# Patient Record
Sex: Female | Born: 1980 | Race: White | Hispanic: No | Marital: Single | Smoking: Current every day smoker
Health system: Southern US, Community
[De-identification: ages and names within clinical notes are randomized; demographics above are authoritative.]

## PROBLEM LIST (undated history)

## (undated) DIAGNOSIS — J40 Bronchitis, not specified as acute or chronic: Secondary | ICD-10-CM

## (undated) DIAGNOSIS — K219 Gastro-esophageal reflux disease without esophagitis: Secondary | ICD-10-CM

## (undated) HISTORY — PX: MYRINGOTOMY: SUR874

---

## 1998-02-18 ENCOUNTER — Encounter: Payer: Self-pay | Admitting: Emergency Medicine

## 1998-02-18 ENCOUNTER — Emergency Department (HOSPITAL_COMMUNITY): Admission: EM | Admit: 1998-02-18 | Discharge: 1998-02-18 | Payer: Self-pay | Admitting: Emergency Medicine

## 1998-08-15 ENCOUNTER — Emergency Department (HOSPITAL_COMMUNITY): Admission: EM | Admit: 1998-08-15 | Discharge: 1998-08-15 | Payer: Self-pay | Admitting: Internal Medicine

## 2004-06-01 ENCOUNTER — Inpatient Hospital Stay (HOSPITAL_COMMUNITY): Admission: EM | Admit: 2004-06-01 | Discharge: 2004-06-02 | Payer: Self-pay | Admitting: Emergency Medicine

## 2004-06-01 ENCOUNTER — Encounter: Payer: Self-pay | Admitting: Emergency Medicine

## 2004-11-05 ENCOUNTER — Ambulatory Visit: Payer: Self-pay | Admitting: Family Medicine

## 2004-11-06 ENCOUNTER — Ambulatory Visit: Payer: Self-pay | Admitting: Family Medicine

## 2004-12-08 ENCOUNTER — Ambulatory Visit: Payer: Self-pay | Admitting: Family Medicine

## 2005-03-14 ENCOUNTER — Emergency Department (HOSPITAL_COMMUNITY): Admission: EM | Admit: 2005-03-14 | Discharge: 2005-03-14 | Payer: Self-pay | Admitting: Emergency Medicine

## 2005-11-09 ENCOUNTER — Emergency Department (HOSPITAL_COMMUNITY): Admission: EM | Admit: 2005-11-09 | Discharge: 2005-11-09 | Payer: Self-pay | Admitting: Emergency Medicine

## 2005-11-12 ENCOUNTER — Emergency Department (HOSPITAL_COMMUNITY): Admission: EM | Admit: 2005-11-12 | Discharge: 2005-11-12 | Payer: Self-pay | Admitting: Emergency Medicine

## 2006-01-18 ENCOUNTER — Emergency Department (HOSPITAL_COMMUNITY): Admission: EM | Admit: 2006-01-18 | Discharge: 2006-01-18 | Payer: Self-pay | Admitting: Emergency Medicine

## 2007-01-20 ENCOUNTER — Encounter: Payer: Self-pay | Admitting: Family Medicine

## 2007-04-20 ENCOUNTER — Emergency Department (HOSPITAL_COMMUNITY): Admission: EM | Admit: 2007-04-20 | Discharge: 2007-04-20 | Payer: Self-pay | Admitting: Emergency Medicine

## 2008-03-12 ENCOUNTER — Emergency Department (HOSPITAL_COMMUNITY): Admission: EM | Admit: 2008-03-12 | Discharge: 2008-03-13 | Payer: Self-pay | Admitting: Emergency Medicine

## 2008-11-28 ENCOUNTER — Emergency Department (HOSPITAL_COMMUNITY): Admission: EM | Admit: 2008-11-28 | Discharge: 2008-11-28 | Payer: Self-pay | Admitting: Emergency Medicine

## 2009-08-31 ENCOUNTER — Emergency Department (HOSPITAL_COMMUNITY): Admission: EM | Admit: 2009-08-31 | Discharge: 2009-08-31 | Payer: Self-pay | Admitting: Emergency Medicine

## 2009-09-03 ENCOUNTER — Emergency Department (HOSPITAL_COMMUNITY): Admission: EM | Admit: 2009-09-03 | Discharge: 2009-09-03 | Payer: Self-pay | Admitting: Emergency Medicine

## 2009-09-23 ENCOUNTER — Emergency Department (HOSPITAL_COMMUNITY): Admission: EM | Admit: 2009-09-23 | Discharge: 2009-09-23 | Payer: Self-pay | Admitting: Emergency Medicine

## 2010-02-18 NOTE — Letter (Signed)
Summary: rpc chart  rpc chart   Imported By: Curtis Sites 10/28/2009 11:22:30  _____________________________________________________________________  External Attachment:    Type:   Image     Comment:   External Document

## 2010-04-04 LAB — CULTURE, ROUTINE-ABSCESS

## 2010-04-23 LAB — URINALYSIS, ROUTINE W REFLEX MICROSCOPIC
Glucose, UA: NEGATIVE mg/dL
Hgb urine dipstick: NEGATIVE
Ketones, ur: NEGATIVE mg/dL
Protein, ur: NEGATIVE mg/dL

## 2010-04-23 LAB — COMPREHENSIVE METABOLIC PANEL
Alkaline Phosphatase: 51 U/L (ref 39–117)
BUN: 8 mg/dL (ref 6–23)
Calcium: 9.7 mg/dL (ref 8.4–10.5)
Creatinine, Ser: 0.69 mg/dL (ref 0.4–1.2)
Glucose, Bld: 96 mg/dL (ref 70–99)
Total Protein: 7.4 g/dL (ref 6.0–8.3)

## 2010-04-23 LAB — DIFFERENTIAL
Basophils Relative: 1 % (ref 0–1)
Lymphs Abs: 3.3 10*3/uL (ref 0.7–4.0)
Monocytes Relative: 6 % (ref 3–12)
Neutro Abs: 6.4 10*3/uL (ref 1.7–7.7)
Neutrophils Relative %: 58 % (ref 43–77)

## 2010-04-23 LAB — CBC
HCT: 40.7 % (ref 36.0–46.0)
Hemoglobin: 13.9 g/dL (ref 12.0–15.0)
MCHC: 34.2 g/dL (ref 30.0–36.0)
RDW: 14 % (ref 11.5–15.5)
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
## 2011-01-02 ENCOUNTER — Emergency Department (HOSPITAL_COMMUNITY)
Admission: EM | Admit: 2011-01-02 | Discharge: 2011-01-02 | Disposition: A | Discharge status: Home / Self Care | Payer: Self-pay | Attending: Emergency Medicine | Admitting: Emergency Medicine

## 2011-01-02 DIAGNOSIS — F17200 Nicotine dependence, unspecified, uncomplicated: Secondary | ICD-10-CM | POA: Insufficient documentation

## 2011-01-02 DIAGNOSIS — K029 Dental caries, unspecified: Secondary | ICD-10-CM | POA: Insufficient documentation

## 2011-01-02 DIAGNOSIS — H9209 Otalgia, unspecified ear: Secondary | ICD-10-CM | POA: Insufficient documentation

## 2011-01-02 DIAGNOSIS — K047 Periapical abscess without sinus: Secondary | ICD-10-CM | POA: Insufficient documentation

## 2011-01-02 DIAGNOSIS — K0381 Cracked tooth: Secondary | ICD-10-CM | POA: Insufficient documentation

## 2011-01-02 MED ORDER — OXYCODONE-ACETAMINOPHEN 5-325 MG PO TABS
1.0000 | ORAL_TABLET | Freq: Once | ORAL | Status: AC
  Administered 2011-01-02: 1 via ORAL
  Filled 2011-01-02: qty 1

## 2011-01-02 MED ORDER — PENICILLIN V POTASSIUM 250 MG PO TABS
500.0000 mg | ORAL_TABLET | Freq: Once | ORAL | Status: AC
  Administered 2011-01-02: 500 mg via ORAL
  Filled 2011-01-02: qty 2

## 2011-01-02 MED ORDER — OXYCODONE-ACETAMINOPHEN 5-325 MG PO TABS: 1.0000 | ORAL_TABLET | ORAL | Status: AC | PRN

## 2011-01-02 MED ORDER — PENICILLIN V POTASSIUM 500 MG PO TABS: 500.0000 mg | ORAL_TABLET | Freq: Three times a day (TID) | ORAL | Status: AC

## 2011-01-02 NOTE — ED Provider Notes (Signed)
Medical screening examination/treatment/procedure(s) were performed by non-physician practitioner and as supervising physician I was immediately available for consultation/collaboration.  Donnetta Hutching, MD 01/02/11 2046

## 2011-01-02 NOTE — ED Provider Notes (Signed)
History     CSN: [id number] Arrival date & time: 01/02/2011  4:03 PM   First MD Initiated Contact with Patient 01/02/11 1641      Chief Complaint  Patient presents with  . Dental Pain    (Consider location/radiation/quality/duration/timing/severity/associated sxs/prior treatment) Patient is a 30 y.o. female presenting with tooth pain. The history is provided by the patient.  Dental PainThe primary symptoms include mouth pain. Primary symptoms do not include headaches, fever, shortness of breath or sore throat. Primary symptoms comment: she has had a fractured tooth for the past 2 years, but it just started causing pain and swelling 2 days ago. The symptoms began 2 days ago. The symptoms are worsening. The symptoms occur constantly.  Additional symptoms include: gum swelling, gum tenderness, jaw pain and ear pain. Additional symptoms do not include: facial swelling, pain with swallowing, excessive salivation and swollen glands. Associated symptoms comments: Patient denies fever . Medical issues include: smoking.    History reviewed. No pertinent past medical history.  History reviewed. No pertinent past surgical history.  No family history on file.  History  Substance Use Topics  . Smoking status: Current Everyday Smoker -- 0.5 packs/day  . Smokeless tobacco: Not on file  . Alcohol Use: No    OB History    Grav Para Term Preterm Abortions TAB SAB Ect Mult Living                  Review of Systems  Constitutional: Negative for fever.  HENT: Positive for ear pain and dental problem. Negative for congestion, sore throat, facial swelling and neck pain.   Eyes: Negative.   Respiratory: Negative for chest tightness and shortness of breath.   Cardiovascular: Negative for chest pain.  Gastrointestinal: Negative for nausea and abdominal pain.  Genitourinary: Negative.   Musculoskeletal: Negative for joint swelling and arthralgias.  Skin: Negative.  Negative for rash and wound.    Neurological: Negative for dizziness, weakness, light-headedness, numbness and headaches.  Hematological: Negative.   Psychiatric/Behavioral: Negative.     Allergies  Hydrocodone  Home Medications   Current Outpatient Rx  Name Route Sig Dispense Refill  . BENZOCAINE 10 % MT GEL Mouth/Throat Use as directed 1 application in the mouth or throat as needed. For tooth pain       BP 132/99  Pulse 100  Temp(Src) 98.5 F (36.9 C) (Oral)  Resp 16  Ht 5\' 6"  (1.676 m)  Wt 235 lb (106.595 kg)  BMI 37.93 kg/m2  SpO2 99%  LMP 12/22/2010  Physical Exam  Constitutional: She is oriented to person, place, and time. She appears well-developed and well-nourished. No distress.  HENT:  Head: Normocephalic and atraumatic.  Right Ear: Tympanic membrane and external ear normal.  Left Ear: Tympanic membrane and external ear normal.  Mouth/Throat: Oropharynx is clear and moist and mucous membranes are normal. No oral lesions. Dental abscesses and dental caries present.    Eyes: Conjunctivae are normal.  Neck: Normal range of motion. Neck supple.  Cardiovascular: Normal rate and normal heart sounds.   Pulmonary/Chest: Effort normal.  Abdominal: She exhibits no distension.  Musculoskeletal: Normal range of motion.  Lymphadenopathy:    She has no cervical adenopathy.  Neurological: She is alert and oriented to person, place, and time.  Skin: Skin is warm and dry. No erythema.  Psychiatric: She has a normal mood and affect.    ED Course  Procedures (including critical care time)  Labs Reviewed - No data to display No  results found.   No diagnosis found.    MDM  Dental abscess.  Penicillin,  Percocet,  Dental referrals given.        Candis Musa, PA 01/02/11 1710

## 2011-01-02 NOTE — ED Notes (Signed)
Pt presents with left sided dental pain.

## 2011-02-18 ENCOUNTER — Emergency Department (HOSPITAL_COMMUNITY)
Admission: EM | Admit: 2011-02-18 | Discharge: 2011-02-18 | Disposition: A | Discharge status: Home / Self Care | Payer: Medicaid Other | Attending: Emergency Medicine | Admitting: Emergency Medicine

## 2011-02-18 ENCOUNTER — Encounter (HOSPITAL_COMMUNITY): Payer: Self-pay | Admitting: Emergency Medicine

## 2011-02-18 DIAGNOSIS — L02411 Cutaneous abscess of right axilla: Secondary | ICD-10-CM

## 2011-02-18 DIAGNOSIS — IMO0002 Reserved for concepts with insufficient information to code with codable children: Secondary | ICD-10-CM | POA: Insufficient documentation

## 2011-02-18 DIAGNOSIS — F17200 Nicotine dependence, unspecified, uncomplicated: Secondary | ICD-10-CM | POA: Insufficient documentation

## 2011-02-18 DIAGNOSIS — L02412 Cutaneous abscess of left axilla: Secondary | ICD-10-CM

## 2011-02-18 MED ORDER — DOXYCYCLINE HYCLATE 100 MG PO TABS
100.0000 mg | ORAL_TABLET | Freq: Once | ORAL | Status: AC
  Administered 2011-02-18: 100 mg via ORAL
  Filled 2011-02-18: qty 1

## 2011-02-18 MED ORDER — LIDOCAINE HCL (PF) 1 % IJ SOLN
INTRAMUSCULAR | Status: AC
  Administered 2011-02-18: 5 mL
  Filled 2011-02-18: qty 5

## 2011-02-18 MED ORDER — HYDROCODONE-ACETAMINOPHEN 5-325 MG PO TABS: ORAL_TABLET | ORAL | Status: DC

## 2011-02-18 MED ORDER — HYDROCODONE-ACETAMINOPHEN 5-325 MG PO TABS
1.0000 | ORAL_TABLET | Freq: Once | ORAL | Status: AC
  Administered 2011-02-18: 1 via ORAL
  Filled 2011-02-18: qty 1

## 2011-02-18 MED ORDER — PROMETHAZINE HCL 25 MG PO TABS: 25.0000 mg | ORAL_TABLET | Freq: Four times a day (QID) | ORAL | Status: AC | PRN

## 2011-02-18 MED ORDER — IBUPROFEN 800 MG PO TABS
800.0000 mg | ORAL_TABLET | Freq: Once | ORAL | Status: AC
  Administered 2011-02-18: 800 mg via ORAL
  Filled 2011-02-18: qty 1

## 2011-02-18 MED ORDER — DOXYCYCLINE HYCLATE 100 MG PO CAPS: 100.0000 mg | ORAL_CAPSULE | Freq: Two times a day (BID) | ORAL | Status: AC

## 2011-02-18 MED ORDER — PROMETHAZINE HCL 12.5 MG PO TABS
25.0000 mg | ORAL_TABLET | Freq: Once | ORAL | Status: AC
  Administered 2011-02-18: 12.5 mg via ORAL
  Filled 2011-02-18: qty 1

## 2011-02-18 NOTE — ED Provider Notes (Signed)
History     CSN: [id number]  Arrival date & time 02/18/11  4098   None     Chief Complaint  Patient presents with  . Abscess    (Consider location/radiation/quality/duration/timing/severity/associated sxs/prior treatment) Patient is a 31 y.o. female presenting with abscess. The history is provided by the patient. No language interpreter was used.  Abscess  This is a new problem. The current episode started more than one week ago. The onset was gradual. The problem has been gradually worsening. Affected Location: both axilla. The problem is moderate. The abscess is characterized by painfulness, swelling and redness. The abscess first occurred at home. Pertinent negatives include no fever. Her past medical history does not include atopy in family or skin abscesses in family. There were no sick contacts. She has received no recent medical care.    History reviewed. No pertinent past medical history.  History reviewed. No pertinent past surgical history.  No family history on file.  History  Substance Use Topics  . Smoking status: Current Everyday Smoker -- 0.5 packs/day  . Smokeless tobacco: Not on file  . Alcohol Use: No    OB History    Grav Para Term Preterm Abortions TAB SAB Ect Mult Living                  Review of Systems  Constitutional: Negative for fever.  Skin:       Abscesses   All other systems reviewed and are negative.    Allergies  Hydrocodone  Home Medications   Current Outpatient Rx  Name Route Sig Dispense Refill  . BC HEADACHE POWDER PO Oral Take 1 packet by mouth every 12 (twelve) hours as needed. pain    . BENZOCAINE 10 % MT GEL Mouth/Throat Use as directed 1 application in the mouth or throat as needed. For tooth pain       BP 153/96  Pulse 86  Temp 100.2 F (37.9 C)  Resp 16  Ht 5\' 6"  (1.676 m)  Wt 219 lb (99.338 kg)  BMI 35.35 kg/m2  SpO2 97%  LMP 02/11/2011  Physical Exam  Nursing note and vitals reviewed. Constitutional:  She is oriented to person, place, and time. She appears well-developed and well-nourished. No distress.  HENT:  Head: Normocephalic and atraumatic.  Eyes: EOM are normal.  Neck: Normal range of motion.  Cardiovascular: Normal rate, regular rhythm and normal heart sounds.   Pulmonary/Chest: Effort normal and breath sounds normal.  Abdominal: Soft. She exhibits no distension. There is no tenderness.  Musculoskeletal: She exhibits tenderness.       Abscesses in B axilla ~ 2.5 x 4 cm.  Red swollen and PT.  No d/c.  Neurological: She is alert and oriented to person, place, and time.  Skin: Skin is warm and dry. She is not diaphoretic.  Psychiatric: She has a normal mood and affect. Judgment normal.    ED Course  INCISION AND DRAINAGE Date/Time: 02/18/2011 11:45 AM Performed by: Worthy Rancher Authorized by: Worthy Rancher Consent: Verbal consent obtained. Written consent not obtained. Risks and benefits: risks, benefits and alternatives were discussed Consent given by: patient Patient understanding: patient states understanding of the procedure being performed Patient consent: the patient's understanding of the procedure matches consent given Site marked: the operative site was not marked Imaging studies: imaging studies not available Required items: required blood products, implants, devices, and special equipment available Patient identity confirmed: verbally with patient Time out: Immediately prior to procedure a "time  out" was called to verify the correct patient, procedure, equipment, support staff and site/side marked as required. Type: abscess Body area: upper extremity (both axilla) Anesthesia: local infiltration Local anesthetic: lidocaine 1% without epinephrine Anesthetic total: 8 ml Patient sedated: no Scalpel size: 11 Incision type: single straight Complexity: simple Drainage: purulent Drainage amount: copious Packing material: 1/4 in iodoform gauze (each lesion  packed with about 24 inches of iodoform)   (including critical care time)  Labs Reviewed - No data to display No results found.   No diagnosis found.    MDM          Worthy Rancher, PA 02/27/11 [phone]

## 2011-02-18 NOTE — ED Notes (Signed)
Bilateral axillary abscesses x 1 week

## 2011-02-21 LAB — CULTURE, ROUTINE-ABSCESS

## 2011-02-22 NOTE — ED Notes (Signed)
Treated with Doxycycline; Sensitive to same

## 2011-02-27 NOTE — ED Provider Notes (Signed)
Medical screening examination/treatment/procedure(s) were performed by non-physician practitioner and as supervising physician I was immediately available for consultation/collaboration. Devoria Albe, MD, FACEP   Ward Givens, MD 02/27/11 [phone]

## 2011-05-11 ENCOUNTER — Emergency Department (HOSPITAL_COMMUNITY)
Admission: EM | Admit: 2011-05-11 | Discharge: 2011-05-11 | Disposition: A | Discharge status: Home / Self Care | Payer: Self-pay | Attending: Emergency Medicine | Admitting: Emergency Medicine

## 2011-05-11 ENCOUNTER — Encounter (HOSPITAL_COMMUNITY): Payer: Self-pay | Admitting: *Deleted

## 2011-05-11 DIAGNOSIS — F17200 Nicotine dependence, unspecified, uncomplicated: Secondary | ICD-10-CM | POA: Insufficient documentation

## 2011-05-11 DIAGNOSIS — M549 Dorsalgia, unspecified: Secondary | ICD-10-CM

## 2011-05-11 DIAGNOSIS — M545 Low back pain, unspecified: Secondary | ICD-10-CM | POA: Insufficient documentation

## 2011-05-11 HISTORY — DX: Bronchitis, not specified as acute or chronic: J40

## 2011-05-11 MED ORDER — OXYCODONE-ACETAMINOPHEN 5-325 MG PO TABS: 1.0000 | ORAL_TABLET | ORAL | Status: AC | PRN

## 2011-05-11 MED ORDER — CYCLOBENZAPRINE HCL 10 MG PO TABS: 10.0000 mg | ORAL_TABLET | Freq: Two times a day (BID) | ORAL | Status: AC | PRN

## 2011-05-11 NOTE — ED Notes (Signed)
Low back pain, chronic, worse last day No recent injury

## 2011-05-11 NOTE — ED Provider Notes (Signed)
Medical screening examination/treatment/procedure(s) were performed by non-physician practitioner and as supervising physician I was immediately available for consultation/collaboration.  Chakita Mcgraw S. Dequarius Jeffries, MD 05/11/11 2215 
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
## 2011-05-11 NOTE — Discharge Instructions (Signed)
Take ibuprofen in addition to the medication we give you today. Follow up with Dr. Hilda Lias. Return here as needed.  Back Exercises Back exercises help treat and prevent back injuries. The goal of back exercises is to increase the strength of your abdominal and back muscles and the flexibility of your back. These exercises should be started when you no longer have back pain. Back exercises include:  Pelvic Tilt. Lie on your back with your knees bent. Tilt your pelvis until the lower part of your back is against the floor. Hold this position 5 to 10 sec and repeat 5 to 10 times.   Knee to Chest. Pull first 1 knee up against your chest and hold for 20 to 30 seconds, repeat this with the other knee, and then both knees. This may be done with the other leg straight or bent, whichever feels better.   Sit-Ups or Curl-Ups. Bend your knees 90 degrees. Start with tilting your pelvis, and do a partial, slow sit-up, lifting your trunk only 30 to 45 degrees off the floor. Take at least 2 to 3 seconds for each sit-up. Do not do sit-ups with your knees out straight. If partial sit-ups are difficult, simply do the above but with only tightening your abdominal muscles and holding it as directed.   Hip-Lift. Lie on your back with your knees flexed 90 degrees. Push down with your feet and shoulders as you raise your hips a couple inches off the floor; hold for 10 seconds, repeat 5 to 10 times.   Back arches. Lie on your stomach, propping yourself up on bent elbows. Slowly press on your hands, causing an arch in your low back. Repeat 3 to 5 times. Any initial stiffness and discomfort should lessen with repetition over time.   Shoulder-Lifts. Lie face down with arms beside your body. Keep hips and torso pressed to floor as you slowly lift your head and shoulders off the floor.  Do not overdo your exercises, especially in the beginning. Exercises may cause you some mild back discomfort which lasts for a few minutes;  however, if the pain is more severe, or lasts for more than 15 minutes, do not continue exercises until you see your caregiver. Improvement with exercise therapy for back problems is slow.  See your caregivers for assistance with developing a proper back exercise program. Document Released: 02/13/2004 Document Revised: 12/25/2010 Document Reviewed: 01/05/2005 [facility] Patient Information 2012 [place], [place].

## 2013-08-07 ENCOUNTER — Emergency Department (HOSPITAL_COMMUNITY)
Admission: EM | Admit: 2013-08-07 | Discharge: 2013-08-07 | Disposition: A | Discharge status: Home / Self Care | Payer: Medicaid Other | Attending: Emergency Medicine | Admitting: Emergency Medicine

## 2013-08-07 ENCOUNTER — Encounter (HOSPITAL_COMMUNITY): Payer: Self-pay | Admitting: Emergency Medicine

## 2013-08-07 DIAGNOSIS — K089 Disorder of teeth and supporting structures, unspecified: Secondary | ICD-10-CM | POA: Insufficient documentation

## 2013-08-07 DIAGNOSIS — F17200 Nicotine dependence, unspecified, uncomplicated: Secondary | ICD-10-CM | POA: Insufficient documentation

## 2013-08-07 DIAGNOSIS — Z8709 Personal history of other diseases of the respiratory system: Secondary | ICD-10-CM | POA: Diagnosis not present

## 2013-08-07 DIAGNOSIS — K029 Dental caries, unspecified: Secondary | ICD-10-CM | POA: Insufficient documentation

## 2013-08-07 DIAGNOSIS — K0889 Other specified disorders of teeth and supporting structures: Secondary | ICD-10-CM

## 2013-08-07 MED ORDER — TRAMADOL HCL 50 MG PO TABS: 50.0000 mg | ORAL_TABLET | Freq: Four times a day (QID) | ORAL | Status: DC | PRN

## 2013-08-07 MED ORDER — TRAMADOL HCL 50 MG PO TABS
50.0000 mg | ORAL_TABLET | Freq: Once | ORAL | Status: AC
  Administered 2013-08-07: 50 mg via ORAL
  Filled 2013-08-07: qty 1

## 2013-08-07 MED ORDER — CLINDAMYCIN HCL 300 MG PO CAPS: 300.0000 mg | ORAL_CAPSULE | Freq: Four times a day (QID) | ORAL | Status: DC

## 2013-08-07 MED ORDER — CLINDAMYCIN HCL 150 MG PO CAPS
300.0000 mg | ORAL_CAPSULE | Freq: Once | ORAL | Status: AC
  Administered 2013-08-07: 300 mg via ORAL
  Filled 2013-08-07: qty 2

## 2013-08-07 NOTE — Discharge Instructions (Signed)
Dental Pain  Toothache is pain in or around a tooth. It may get worse with chewing or with cold or heat.   HOME CARE  · Your dentist may use a numbing medicine during treatment. If so, you may need to avoid eating until the medicine wears off. Ask your dentist about this.  · Only take medicine as told by your dentist or doctor.  · Avoid chewing food near the painful tooth until after all treatment is done. Ask your dentist about this.  GET HELP RIGHT AWAY IF:   · The problem gets worse or new problems appear.  · You have a fever.  · There is redness and puffiness (swelling) of the face, jaw, or neck.  · You cannot open your mouth.  · There is pain in the jaw.  · There is very bad pain that is not helped by medicine.  MAKE SURE YOU:   · Understand these instructions.  · Will watch your condition.  · Will get help right away if you are not doing well or get worse.  Document Released: 06/24/2007 Document Revised: 03/30/2011 Document Reviewed: 06/24/2007  [employer]® Patient Information ©2015 [employer]. This information is not intended to replace advice given to you by your health care provider. Make sure you discuss any questions you have with your health care provider.

## 2013-08-07 NOTE — ED Notes (Signed)
PT c/o right upper and lower dental pain for one week.

## 2013-08-07 NOTE — ED Provider Notes (Signed)
CSN: [id number]     Arrival date & time 08/07/13  0810 History   First MD Initiated Contact with Patient 08/07/13 [phone]     Chief Complaint  Patient presents with  . Dental Pain     (Consider location/radiation/quality/duration/timing/severity/associated sxs/prior Treatment) Patient is a 33 y.o. female presenting with tooth pain. The history is provided by the patient.  Dental Pain Location:  Lower and upper Upper teeth location:  5/RU 1st bicuspid Lower teeth location:  31/RL 2nd molar, 30/RL 1st molar, 28/RL 1st bicuspid and 29/RL 2nd bicuspid Quality:  Dull and throbbing Severity:  Moderate Onset quality:  Gradual Duration:  1 week Timing:  Constant Progression:  Worsening Chronicity:  New Context: dental caries and poor dentition   Context: not abscess, not malocclusion, not recent dental surgery and not trauma   Relieved by:  Nothing Worsened by:  Cold food/drink, hot food/drink and pressure Ineffective treatments:  None tried Associated symptoms: facial pain   Associated symptoms: no congestion, no difficulty swallowing, no drooling, no facial swelling, no fever, no gum swelling, no headaches, no neck pain, no neck swelling, no oral bleeding, no oral lesions and no trismus   Risk factors: lack of dental care, periodontal disease and smoking   Risk factors: no diabetes     Past Medical History  Diagnosis Date  . Bronchitis    History reviewed. No pertinent past surgical history. No family history on file. History  Substance Use Topics  . Smoking status: Current Every Day Smoker -- 1.00 packs/day    Types: Cigarettes  . Smokeless tobacco: Not on file  . Alcohol Use: No   OB History   Grav Para Term Preterm Abortions TAB SAB Ect Mult Living                 Review of Systems  Constitutional: Negative for fever and appetite change.  HENT: Positive for dental problem. Negative for congestion, drooling, facial swelling, mouth sores, sore throat and trouble  swallowing.   Eyes: Negative for pain and visual disturbance.  Musculoskeletal: Negative for neck pain and neck stiffness.  Neurological: Negative for dizziness, facial asymmetry and headaches.  Hematological: Negative for adenopathy.  All other systems reviewed and are negative.     Allergies  Hydrocodone  Home Medications   Prior to Admission medications   Not on File   BP 144/88  Temp(Src) 98.6 F (37 C) (Oral)  Resp 18  Ht 5\' 6"  (1.676 m)  Wt 205 lb (92.987 kg)  BMI 33.10 kg/m2  SpO2 99%  LMP 07/25/2013 Physical Exam  Nursing note and vitals reviewed. Constitutional: She is oriented to person, place, and time. She appears well-developed and well-nourished. No distress.  HENT:  Head: Normocephalic and atraumatic.  Right Ear: Tympanic membrane and ear canal normal.  Left Ear: Tympanic membrane and ear canal normal.  Mouth/Throat: Uvula is midline, oropharynx is clear and moist and mucous membranes are normal. No trismus in the jaw. Dental caries present. No dental abscesses or uvula swelling.  Multiple dental caries and ttp of the right lower molars and right upper premolar and premolars.  No facial swelling, obvious dental abscess, trismus, or sublingual abnml.    Neck: Normal range of motion. Neck supple.  Cardiovascular: Normal rate, regular rhythm and normal heart sounds.   No murmur heard. Pulmonary/Chest: Effort normal and breath sounds normal.  Musculoskeletal: Normal range of motion.  Lymphadenopathy:    She has no cervical adenopathy.  Neurological: She is alert and oriented  to person, place, and time. She exhibits normal muscle tone. Coordination normal.  Skin: Skin is warm and dry.    ED Course  Procedures (including critical care time) Labs Review Labs Reviewed - No data to display  Imaging Review No results found.   EKG Interpretation None      MDM   Final diagnoses:  Pain, dental    Pt is well appearing.  Airway patent.  No concerning  sx's for infection to the floor of the mouth or deep structures of the neck.  Pt agrees to close f/u with dentistry, rx for clindamycin and ultram for pain.     Calene Paradiso L. Trisha Mangleriplett, PA-C 08/08/13 1546

## 2013-08-16 NOTE — ED Provider Notes (Signed)
Medical screening examination/treatment/procedure(s) were performed by non-physician practitioner and as supervising physician I was immediately available for consultation/collaboration.   EKG Interpretation None        Rolland PorterMark Lindsey Hommel, MD 08/16/13 [phone]

## 2014-04-11 ENCOUNTER — Encounter (HOSPITAL_COMMUNITY)
Admission: RE | Admit: 2014-04-11 | Discharge: 2014-04-11 | Disposition: A | Discharge status: Home / Self Care | Payer: Medicaid Other | Source: Ambulatory Visit | Attending: Oral Surgery | Admitting: Oral Surgery

## 2014-04-11 ENCOUNTER — Encounter (HOSPITAL_COMMUNITY): Payer: Self-pay

## 2014-04-11 DIAGNOSIS — K219 Gastro-esophageal reflux disease without esophagitis: Secondary | ICD-10-CM | POA: Diagnosis not present

## 2014-04-11 DIAGNOSIS — F17210 Nicotine dependence, cigarettes, uncomplicated: Secondary | ICD-10-CM | POA: Diagnosis not present

## 2014-04-11 DIAGNOSIS — Z6832 Body mass index (BMI) 32.0-32.9, adult: Secondary | ICD-10-CM | POA: Diagnosis not present

## 2014-04-11 DIAGNOSIS — K088 Other specified disorders of teeth and supporting structures: Secondary | ICD-10-CM | POA: Diagnosis present

## 2014-04-11 DIAGNOSIS — M278 Other specified diseases of jaws: Secondary | ICD-10-CM | POA: Diagnosis not present

## 2014-04-11 HISTORY — DX: Gastro-esophageal reflux disease without esophagitis: K21.9

## 2014-04-11 LAB — HCG, SERUM, QUALITATIVE: Preg, Serum: NEGATIVE

## 2014-04-11 LAB — CBC
HCT: 43.8 % (ref 36.0–46.0)
HEMOGLOBIN: 15.1 g/dL — AB (ref 12.0–15.0)
MCH: 30.1 pg (ref 26.0–34.0)
MCHC: 34.5 g/dL (ref 30.0–36.0)
MCV: 87.4 fL (ref 78.0–100.0)
Platelets: 204 10*3/uL (ref 150–400)
RBC: 5.01 MIL/uL (ref 3.87–5.11)
RDW: 13.7 % (ref 11.5–15.5)
WBC: 14.6 10*3/uL — ABNORMAL HIGH (ref 4.0–10.5)

## 2014-04-11 NOTE — Pre-Procedure Instructions (Addendum)
Thereasa DistanceVictoria D Aguilar  04/11/2014   Your procedure is scheduled on:  04/20/14  [facility] at 630 AM.  Call this number if you have problems the morning of surgery: [phone]   Remember:   Do not eat food or drink liquids after midnight.   Take these medicines the morning of surgery with A SIP OF WATER: none   STOP all herbel meds, nsaids (aleve,naproxen,advil,ibuprofen) 5 days prior to surgery starting 04/05/14 including aspirin, vitamins   Do not wear jewelry, make-up or nail polish.  Do not wear lotions, powders, or perfumes. You may wear deodorant.  Do not shave 48 hours prior to surgery. Men may shave face and neck.  Do not bring valuables to the hospital.  [facility] is not responsible                  for any belongings or valuables.               Contacts, dentures or bridgework may not be worn into surgery.  Leave suitcase in the car. After surgery it may be brought to your room.  For patients admitted to the hospital, discharge time is determined by your                treatment team.               Patients discharged the day of surgery will not be allowed to drive  home.  Name and phone number of your driver:   Special Instructions:  Special Instructions: [place] - Preparing for Surgery  Before surgery, you can play an important role.  Because skin is not sterile, your skin needs to be as free of germs as possible.  You can reduce the number of germs on you skin by washing with CHG (chlorahexidine gluconate) soap before surgery.  CHG is an antiseptic cleaner which kills germs and bonds with the skin to continue killing germs even after washing.  Please DO NOT use if you have an allergy to CHG or antibacterial soaps.  If your skin becomes reddened/irritated stop using the CHG and inform your nurse when you arrive at Short Stay.  Do not shave (including legs and underarms) for at least 48 hours prior to the first CHG shower.  You may shave your  face.  Please follow these instructions carefully:   1.  Shower with CHG Soap the night before surgery and the morning of Surgery.  2.  If you choose to wash your hair, wash your hair first as usual with your normal shampoo.  3.  After you shampoo, rinse your hair and body thoroughly to remove the Shampoo.  4.  Use CHG as you would any other liquid soap.  You can apply chg directly  to the skin and wash gently with scrungie or a clean washcloth.  5.  Apply the CHG Soap to your body ONLY FROM THE NECK DOWN.  Do not use on open wounds or open sores.  Avoid contact with your eyes ears, mouth and genitals (private parts).  Wash genitals (private parts)       with your normal soap.  6.  Wash thoroughly, paying special attention to the area where your surgery will be performed.  7.  Thoroughly rinse your body with warm water from the neck down.  8.  DO NOT shower/wash with your normal soap after using and rinsing off the CHG Soap.  9.  Pat yourself  dry with a clean towel.            10.  Wear clean pajamas.            11.  Place clean sheets on your bed the night of your first shower and do not sleep with pets.  Day of Surgery  Do not apply any lotions/deodorants the morning of surgery.  Please wear clean clothes to the hospital/surgery center.   Please read over the following fact sheets that you were given: Pain Booklet, Coughing and Deep Breathing and Surgical Site Infection Prevention

## 2014-04-11 NOTE — Progress Notes (Signed)
Anesthesia PAT Evaluation:  Patient is a 34 year old female scheduled for multiple extractions with alveoloplasty and tori on 04/20/14 by Dr. Barbette MerinoJensen. Patient reports all remaining teeth are being removed. Room is booked for 1 hour. Anesthesia is posted for [facility], although Dr. Randa EvensJensen's note indicates general anesthesia which is what I told I anticipated considering her surgery.      History includes smoking since age 34, GERD, bronchitis.  BMI is consistent with mild obesity.  No current meds. She denied illicit drug use. PCP is Kizzie Furnishochelle Muse, [facility]-C [facility].  For anesthesia concerns, she expressed concerns about her breathing and that her mother "stopped breathing" during dental surgery. She thinks her mother's dental surgery was performed at a dental office.  Her mom had no long-term adverse effects.  PAT Vitals: T 36.8, HR 66, RR 18, BP 137/81, O2 99%.   Preoperative labs noted.   Patient denies any recent URI.  She has periodically had to use an inhaler in the past for acute respiratory illness, but does not have one now.  She has a periodic dry cough.  She does have chronic DOE with activities such as yard work and walking up hill--reports no change in her symptoms in the past year. Exam shows clear lungs, no wheezes or rhonchi.  Heart RRR, no murmur.   Patient is a smoker but without acute respiratory issues.  She knows she should be at her baseline for surgery and should let her PCP and Dr. Barbette MerinoJensen know if any changes.  She was encouraged to stop smoking.  She is nervous about surgery particularly with what happened to her mother.  We discussed anesthesiologist/CRNA and RN monitoring during and after her surgery which I think helped.  She will be further evaluated by her assigned anesthesiologist on the day of surgery, but if no acute changes then I anticipate that she can proceed as planned.  Michelle Ochsllison Josie Mesa, PA-C [facility]/Anesthesiology Phone [phone]  [phone] 04/11/2014 1:57 PM

## 2014-04-11 NOTE — H&P (Signed)
HISTORY AND PHYSICAL  Michelle DistanceVictoria D Aguilar is a 34 y.o. female patient referred by general dentist for full mouth extractions.  No diagnosis found.  Past Medical History  Diagnosis Date  . Bronchitis     No current facility-administered medications for this encounter.   Current Outpatient Prescriptions  Medication Sig Dispense Refill  . clindamycin (CLEOCIN) 300 MG capsule Take 1 capsule (300 mg total) by mouth 4 (four) times daily. For 10 days (Patient not taking: Reported on 04/11/2014) 40 capsule 0  . traMADol (ULTRAM) 50 MG tablet Take 1 tablet (50 mg total) by mouth every 6 (six) hours as needed. (Patient not taking: Reported on 04/11/2014) 20 tablet 0   No Known Allergies Active Problems:   * No active hospital problems. *  Vitals: There were no vitals taken for this visit. Lab results:No results found for this or any previous visit (from the past 24 hour(s)). Radiology Results: No results found. General appearance: alert, cooperative and moderately obese Head: Normocephalic, without obvious abnormality, atraumatic Eyes: negative Nose: Nares normal. Septum midline. Mucosa normal. No drainage or sinus tenderness. Throat: Severe dental caries teeth #'s 1, 2, 4, 5, 6, 11, 15. Bone loss with mobility teeth #'s 20, 21, 22, 23, 24, 25, 26, 27, 28, 29,  severe decayk teeth #0, 31, 32. 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 Neck: no adenopathy, supple, symmetrical, trachea midline and thyroid not enlarged, symmetric, no tenderness/mass/nodules Resp: clear to auscultation bilaterally Cardio: regular rate and rhythm, S1, S2 normal, no murmur, click, rub or gallop  Assessment:All teeth nonrestorable. 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  Plan: Full mouth extraction. Alveoloplasty. Removal bilateral mandibular lingual tori. Removal maxillary exostosis. General anesthesia. Day surgery.   Georgia LopesJENSEN,Nello Corro M 04/11/2014

## 2014-04-19 MED ORDER — CEFAZOLIN SODIUM-DEXTROSE 2-3 GM-% IV SOLR
2.0000 g | INTRAVENOUS | Status: AC
  Administered 2014-04-20: 2 g via INTRAVENOUS
  Filled 2014-04-19: qty 50

## 2014-04-19 NOTE — Progress Notes (Signed)
Pt called back and verbalized understanding to arrive at 0530 in the morning.

## 2014-04-19 NOTE — Progress Notes (Signed)
Per Gearldine BienenstockBrandy, RN at [employer] desk, pt called and asked to come in at 0530 in the morning due to a cancellation for Dr. Barbette MerinoJensen. Message left on cell phone #[phone], Vivia EwingPercy Weatherford, pt's fiance due to pt no longer having minutes on her cell phone. Requested that pt call OR desk to confirm message received as this nurse is leaving for the night.

## 2014-04-20 ENCOUNTER — Ambulatory Visit (HOSPITAL_COMMUNITY): Payer: Medicaid Other | Admitting: Vascular Surgery

## 2014-04-20 ENCOUNTER — Encounter (HOSPITAL_COMMUNITY)
Admission: RE | Disposition: A | Discharge status: Home / Self Care | Payer: Self-pay | Source: Ambulatory Visit | Attending: Oral Surgery

## 2014-04-20 ENCOUNTER — Encounter (HOSPITAL_COMMUNITY): Payer: Self-pay | Admitting: *Deleted

## 2014-04-20 ENCOUNTER — Ambulatory Visit (HOSPITAL_COMMUNITY): Payer: Medicaid Other | Admitting: Anesthesiology

## 2014-04-20 ENCOUNTER — Ambulatory Visit (HOSPITAL_COMMUNITY)
Admission: RE | Admit: 2014-04-20 | Discharge: 2014-04-20 | Disposition: A | Discharge status: Home / Self Care | Payer: Medicaid Other | Source: Ambulatory Visit | Attending: Oral Surgery | Admitting: Oral Surgery

## 2014-04-20 DIAGNOSIS — Z6832 Body mass index (BMI) 32.0-32.9, adult: Secondary | ICD-10-CM | POA: Insufficient documentation

## 2014-04-20 DIAGNOSIS — K088 Other specified disorders of teeth and supporting structures: Secondary | ICD-10-CM | POA: Insufficient documentation

## 2014-04-20 DIAGNOSIS — K219 Gastro-esophageal reflux disease without esophagitis: Secondary | ICD-10-CM | POA: Diagnosis not present

## 2014-04-20 DIAGNOSIS — F17210 Nicotine dependence, cigarettes, uncomplicated: Secondary | ICD-10-CM | POA: Insufficient documentation

## 2014-04-20 DIAGNOSIS — M278 Other specified diseases of jaws: Secondary | ICD-10-CM | POA: Diagnosis not present

## 2014-04-20 HISTORY — PX: MULTIPLE EXTRACTIONS WITH ALVEOLOPLASTY: SHX5342

## 2014-04-20 SURGERY — MULTIPLE EXTRACTION WITH ALVEOLOPLASTY
Anesthesia: General | Site: Mouth

## 2014-04-20 MED ORDER — LIDOCAINE-EPINEPHRINE 2 %-1:100000 IJ SOLN
INTRAMUSCULAR | Status: AC
  Filled 2014-04-20: qty 1

## 2014-04-20 MED ORDER — LIDOCAINE-EPINEPHRINE 2 %-1:100000 IJ SOLN
INTRAMUSCULAR | Status: DC | PRN
  Administered 2014-04-20: 20 mL via INTRADERMAL

## 2014-04-20 MED ORDER — MIDAZOLAM HCL 2 MG/2ML IJ SOLN
INTRAMUSCULAR | Status: AC
  Filled 2014-04-20: qty 2

## 2014-04-20 MED ORDER — SODIUM CHLORIDE 0.9 % IR SOLN
Status: DC | PRN
  Administered 2014-04-20: 1000 mL

## 2014-04-20 MED ORDER — SUCCINYLCHOLINE CHLORIDE 20 MG/ML IJ SOLN
INTRAMUSCULAR | Status: DC | PRN
  Administered 2014-04-20: 100 mg via INTRAVENOUS

## 2014-04-20 MED ORDER — PROPOFOL 10 MG/ML IV BOLUS
INTRAVENOUS | Status: DC | PRN
  Administered 2014-04-20: 180 mg via INTRAVENOUS
  Administered 2014-04-20: 20 mg via INTRAVENOUS

## 2014-04-20 MED ORDER — ARTIFICIAL TEARS OP OINT
TOPICAL_OINTMENT | OPHTHALMIC | Status: DC | PRN
  Administered 2014-04-20: 1 via OPHTHALMIC

## 2014-04-20 MED ORDER — OXYCODONE-ACETAMINOPHEN 5-325 MG PO TABS: 1.0000 | ORAL_TABLET | ORAL | Status: DC | PRN

## 2014-04-20 MED ORDER — OXYCODONE-ACETAMINOPHEN 5-325 MG PO TABS
1.0000 | ORAL_TABLET | ORAL | Status: DC | PRN
  Administered 2014-04-20: 1 via ORAL

## 2014-04-20 MED ORDER — MIDAZOLAM HCL 5 MG/5ML IJ SOLN
INTRAMUSCULAR | Status: DC | PRN
  Administered 2014-04-20: 2 mg via INTRAVENOUS

## 2014-04-20 MED ORDER — ONDANSETRON HCL 4 MG/2ML IJ SOLN
INTRAMUSCULAR | Status: DC | PRN
  Administered 2014-04-20: 4 mg via INTRAVENOUS

## 2014-04-20 MED ORDER — HYDROMORPHONE HCL 1 MG/ML IJ SOLN
0.2500 mg | INTRAMUSCULAR | Status: DC | PRN
  Administered 2014-04-20 (×2): 0.5 mg via INTRAVENOUS

## 2014-04-20 MED ORDER — CHLORHEXIDINE GLUCONATE 0.12 % MT SOLN: 15.0000 mL | Freq: Two times a day (BID) | OROMUCOSAL | Status: DC

## 2014-04-20 MED ORDER — HYDROMORPHONE HCL 1 MG/ML IJ SOLN
INTRAMUSCULAR | Status: AC
  Filled 2014-04-20: qty 1

## 2014-04-20 MED ORDER — OXYCODONE-ACETAMINOPHEN 5-325 MG PO TABS
ORAL_TABLET | ORAL | Status: AC
  Filled 2014-04-20: qty 1

## 2014-04-20 MED ORDER — SUCCINYLCHOLINE CHLORIDE 20 MG/ML IJ SOLN
INTRAMUSCULAR | Status: AC
  Filled 2014-04-20: qty 1

## 2014-04-20 MED ORDER — MIDAZOLAM HCL 2 MG/2ML IJ SOLN
1.0000 mg | Freq: Once | INTRAMUSCULAR | Status: AC
  Administered 2014-04-20: 1 mg via INTRAVENOUS

## 2014-04-20 MED ORDER — LIDOCAINE HCL (CARDIAC) 20 MG/ML IV SOLN
INTRAVENOUS | Status: DC | PRN
  Administered 2014-04-20: 40 mg via INTRAVENOUS

## 2014-04-20 MED ORDER — LIDOCAINE HCL (CARDIAC) 20 MG/ML IV SOLN
INTRAVENOUS | Status: AC
  Filled 2014-04-20: qty 5

## 2014-04-20 MED ORDER — DEXAMETHASONE SODIUM PHOSPHATE 4 MG/ML IJ SOLN
INTRAMUSCULAR | Status: DC | PRN
  Administered 2014-04-20: 4 mg via INTRAVENOUS

## 2014-04-20 MED ORDER — OXYMETAZOLINE HCL 0.05 % NA SOLN
NASAL | Status: DC | PRN
  Administered 2014-04-20: 2 via NASAL

## 2014-04-20 MED ORDER — LACTATED RINGERS IV SOLN
INTRAVENOUS | Status: DC | PRN
  Administered 2014-04-20: 07:00:00 via INTRAVENOUS

## 2014-04-20 MED ORDER — ONDANSETRON HCL 4 MG/2ML IJ SOLN: 4.0000 mg | Freq: Once | INTRAMUSCULAR | Status: DC | PRN

## 2014-04-20 MED ORDER — FENTANYL CITRATE 0.05 MG/ML IJ SOLN
INTRAMUSCULAR | Status: DC | PRN
  Administered 2014-04-20 (×5): 50 ug via INTRAVENOUS

## 2014-04-20 MED ORDER — ONDANSETRON HCL 4 MG/2ML IJ SOLN
INTRAMUSCULAR | Status: AC
  Filled 2014-04-20: qty 2

## 2014-04-20 MED ORDER — 0.9 % SODIUM CHLORIDE (POUR BTL) OPTIME
TOPICAL | Status: DC | PRN
  Administered 2014-04-20: 1000 mL

## 2014-04-20 MED ORDER — FENTANYL CITRATE 0.05 MG/ML IJ SOLN
INTRAMUSCULAR | Status: AC
  Filled 2014-04-20: qty 5

## 2014-04-20 SURGICAL SUPPLY — 31 items
BLADE SURG 15 STRL LF DISP TIS (BLADE) ×1 IMPLANT
BLADE SURG 15 STRL SS (BLADE) ×3
BUR CROSS CUT FISSURE 1.6 (BURR) ×2 IMPLANT
BUR CROSS CUT FISSURE 1.6MM (BURR) ×1
BUR EGG ELITE 4.0 (BURR) ×2 IMPLANT
BUR EGG ELITE 4.0MM (BURR) ×1
CANISTER SUCTION 2500CC (MISCELLANEOUS) ×3 IMPLANT
COVER SURGICAL LIGHT HANDLE (MISCELLANEOUS) ×3 IMPLANT
CRADLE DONUT ADULT HEAD (MISCELLANEOUS) ×3 IMPLANT
GAUZE PACKING FOLDED 2  STR (GAUZE/BANDAGES/DRESSINGS) ×2
GAUZE PACKING FOLDED 2 STR (GAUZE/BANDAGES/DRESSINGS) ×1 IMPLANT
GLOVE BIO SURGEON STRL SZ 6.5 (GLOVE) ×2 IMPLANT
GLOVE BIO SURGEON STRL SZ7.5 (GLOVE) ×3 IMPLANT
GLOVE BIO SURGEONS STRL SZ 6.5 (GLOVE) ×1
GLOVE BIOGEL PI IND STRL 7.0 (GLOVE) ×1 IMPLANT
GLOVE BIOGEL PI INDICATOR 7.0 (GLOVE) ×2
GOWN STRL REUS W/ TWL LRG LVL3 (GOWN DISPOSABLE) ×1 IMPLANT
GOWN STRL REUS W/ TWL XL LVL3 (GOWN DISPOSABLE) ×1 IMPLANT
GOWN STRL REUS W/TWL LRG LVL3 (GOWN DISPOSABLE) ×3
GOWN STRL REUS W/TWL XL LVL3 (GOWN DISPOSABLE) ×3
KIT BASIN OR (CUSTOM PROCEDURE TRAY) ×3 IMPLANT
KIT ROOM TURNOVER OR (KITS) ×3 IMPLANT
NEEDLE 22X1 1/2 (OR ONLY) (NEEDLE) ×4 IMPLANT
NS IRRIG 1000ML POUR BTL (IV SOLUTION) ×3 IMPLANT
PAD ARMBOARD 7.5X6 YLW CONV (MISCELLANEOUS) ×3 IMPLANT
SUT CHROMIC 3 0 PS 2 (SUTURE) ×6 IMPLANT
SYR CONTROL 10ML LL (SYRINGE) ×4 IMPLANT
TOWEL OR 17X26 10 PK STRL BLUE (TOWEL DISPOSABLE) ×3 IMPLANT
TRAY ENT MC OR (CUSTOM PROCEDURE TRAY) ×3 IMPLANT
TUBING IRRIGATION (MISCELLANEOUS) ×3 IMPLANT
YANKAUER SUCT BULB TIP NO VENT (SUCTIONS) ×3 IMPLANT

## 2014-04-20 NOTE — Transfer of Care (Signed)
Immediate Anesthesia Transfer of Care Note  Patient: Michelle Aguilar  Procedure(s) Performed: Procedure(s): MULTIPLE EXTRACTIONS WITH ALVEOLOPLASTY AND TORI (N/A)  Patient Location: PACU  Anesthesia Type:General  Level of Consciousness: awake, alert  and oriented  Airway & Oxygen Therapy: Patient Spontanous Breathing and Patient connected to face mask oxygen  Post-op Assessment: Report given to RN, Post -op Vital signs reviewed and stable and Patient moving all extremities X 4  Post vital signs: Reviewed and stable  Last Vitals:  Filed Vitals:   04/20/14 0830  BP:   Pulse:   Temp: 36.6 C  Resp:    HR 95, Sats 95% on 6LFM, RR 30, BP 150/75 Complications: No apparent anesthesia complications

## 2014-04-20 NOTE — Op Note (Signed)
NAME:  Michelle Aguilar, Michelle Aguilar            ACCOUNT NO.:  [id number]  MEDICAL RECORD NO.:  [id number]  LOCATION:  [facility]                         FACILITY:  [facility]  PHYSICIAN:  Georgia LopesScott M. Latrel Szymczak, M.D.  DATE OF BIRTH:  May 04, 1980  DATE OF PROCEDURE:  04/20/2014 DATE OF DISCHARGE:                              OPERATIVE REPORT   PREOPERATIVE DIAGNOSIS:  Nonrestorable teeth numbers 1, 2, 4, 5, 6, 11, 15, 17, 20, 21, 22, 23, 24, 25, 26, 27, 28, 29, 30, 31, 32.  Bilateral mandibular lingual tori.  Maxillary left buccal exostosis.  POSTOPERATIVE DIAGNOSES:  Nonrestorable teeth numbers 1, 2, 4, 5, 6, 11, 15, 17, 20, 21, 22, 23, 24, 25, 26, 27, 28, 29, 30, 31, 32.  Bilateral mandibular lingual tori.  Maxillary left buccal exostosis.  PROCEDURE:  Multiple extraction teeth numbers 1, 2, 4, 5, 6, 11, 15, 17, 20, 21, 22, 23, 24, 25, 26, 27, 28, 29, 30, 31, 32.  Alveoplasty of right and left maxilla and mandible.  Removal of bilateral mandibular lingual tori.  Removal of maxillary left buccal exostosis.  SURGEON:  Georgia LopesScott M. Mataeo Ingwersen, M.D.  ANESTHESIA:  General, nasal intubation.  Dr. Debbora PrestoJohn Flynn attending.  DESCRIPTION OF PROCEDURE:  The patient was taken to the operating room, placed on the table in supine position.  General anesthesia was administered intravenously and a nasal endotracheal tube was placed and secured.  The eyes were protected.  Then, the patient was draped for the procedure.  Time-out was performed.  The posterior pharynx was suctioned.  A throat pack was placed.  A 2% lidocaine with 1:100,000 epinephrine was infiltrated in inferior alveolar block on the right and left side and a buccal and palatal infiltration in the maxilla around the teeth to be removed; total of 20 mL was utilized.  The bite block was placed in the right side of the mouth, and a sweetheart retractor was used to retract the tongue.  A 15 blade was used to make an incision beginning at tooth #17, carrying forward to teeth  numbers 20, 21, 22, 23, 24, 25, 26 on the buccal and lingual surfaces of the gingival sulcus in the mandible.  Then, in the maxilla, a 15 blade was used to make an incision beginning at tooth #15 and carrying anteriorly to tooth #11 and extending to the midline to the area of approximately tooth #9.  The periosteum was then reflected from around the mandibular teeth with a periosteal elevator, and the periosteum was reflected in the maxilla with the periosteal elevator.  Then, the teeth were elevated with a 301 elevator and removed from the mouth with a dental forceps.  Sockets were then curetted and then periosteum was reflected to expose the alveolar crest in the maxilla and mandible; and in the mandible, the periosteal elevators were used to reflect the periosteum to expose the lingual torus.  Then, the alveoplasty was performed using the egg-shaped burr under irrigation in the maxilla and mandible, and lingual torus was removed with the egg-shaped burr as well.  Then, the bone file was used to further smooth these areas and then they were irrigated and closed with 3-0 chromic.  The bite block and sweetheart retractor  were repositioned to the other side of the mouth.  Then, a 15 blade was used to make an incision beginning at tooth #32 carrying anteriorly to tooth #27 on the buccal and lingual aspects in the gingival sulcus and in the maxilla.  The incision was made from teeth numbers 1 to 6 and carried anteriorly to approximately the midline on the buccal and palatal aspects of the teeth in the mixed gingival sulcus.  The periosteum was reflected.  The teeth were elevated with a 301 elevator and removed from the mouth with dental forceps.  Then, the periosteum was further reflected to expose the alveolar crest in both the maxilla and mandible and to expose the lingual torus of the right mandible.  Then, the egg- shaped burr was used under irrigation to perform alveoplasty and  to remove the mandibular lingual torus.  Then, the areas were further smoothed with a bone file.  Prior to closure of the left maxilla, bony exostosis was reduced using the egg-shaped burr and bone file.  Then, the right maxilla and mandible were irrigated and closed with 3-0 chromic.  The oral cavity was inspected and found to have good contour hemostasis and closure.  The oral cavity was suctioned.  Throat pack was removed, and the patient was awakened, taken to the recovery room, breathing spontaneously in good condition.  ESTIMATED BLOOD LOSS:  Minimal.  COMPLICATIONS:  None.  SPECIMENS:  None.     Georgia Lopes, M.D.     SMJ/MEDQ  D:  04/20/2014  T:  04/20/2014  Job:  [id number]

## 2014-04-20 NOTE — Anesthesia Postprocedure Evaluation (Signed)
  Anesthesia Post-op Note  Patient: Michelle Aguilar  Procedure(s) Performed: Procedure(s): MULTIPLE EXTRACTIONS WITH ALVEOLOPLASTY AND TORI (N/A)  Patient Location: PACU  Anesthesia Type:General  Level of Consciousness: awake, alert  and oriented  Airway and Oxygen Therapy: Patient Spontanous Breathing  Post-op Pain: mild  Post-op Assessment: Post-op Vital signs reviewed, Patient's Cardiovascular Status Stable, Respiratory Function Stable, Patent Airway and Pain level controlled  Post-op Vital Signs: stable  Last Vitals:  Filed Vitals:   04/20/14 0949  BP: 155/71  Pulse: 65  Temp:   Resp:     Complications: No apparent anesthesia complications

## 2014-04-20 NOTE — Anesthesia Procedure Notes (Signed)
Procedure Name: Intubation Date/Time: 04/20/2014 7:31 AM Performed by: Glo HerringLEE, Sunni Richardson B Pre-anesthesia Checklist: Patient identified, Timeout performed, Emergency Drugs available, Suction available and Patient being monitored Patient Re-evaluated:Patient Re-evaluated prior to inductionOxygen Delivery Method: Circle system utilized Preoxygenation: Pre-oxygenation with 100% oxygen Intubation Type: IV induction Ventilation: Mask ventilation without difficulty Laryngoscope Size: Mac and 3 Grade View: Grade I Nasal Tubes: Left, Nasal prep performed, Nasal Rae and Magill forceps- large, utilized Tube size: 7.5 mm Number of attempts: 1 Placement Confirmation: CO2 detector,  positive ETCO2,  ETT inserted through vocal cords under direct vision and breath sounds checked- equal and bilateral Tube secured with: Tape Dental Injury: Teeth and Oropharynx as per pre-operative assessment

## 2014-04-20 NOTE — Op Note (Signed)
04/20/2014  8:23 AM  PATIENT:  Michelle Aguilar  34 y.o. female  PRE-OPERATIVE DIAGNOSIS:  NON RESTORABLE TEETH #'s 1, 2, 4, 5, 6, 11, 15, 17, 20, 21, 22, 23, 24, 25, 26, 27, 28, 29, 30, 31, 32, Bilateral mandibular lingual tori, maxillary left buccal exostosis  POST-OPERATIVE DIAGNOSIS:  SAME  PROCEDURE:  Procedure(s): MULTIPLE EXTRACTION  TEETH #'s 1, 2, 4, 5, 6, 11, 15, 17, 20, 21, 22, 23, 24, 25, 26, 27, 28, 29, 30, 31, 32,; ALVEOLOPLASTY;  REMOVAL BILATERAL MANDIBULAR TORI, REMOVAL MAXILLARY LEFT BUCCAL EXOSTOSIS  SURGEON:  Surgeon(s): Ocie DoyneScott Shanen Norris, DDS  ANESTHESIA:    general  EBL:  minimal  DRAINS: none   SPECIMEN:  No Specimen  COUNTS:  YES  PLAN OF CARE: Discharge to home after PACU  PATIENT DISPOSITION:  PACU - hemodynamically stable.   PROCEDURE DETAILS: Dictation #[id number]  Georgia LopesScott M. Lorry Anastasi, DMD 04/20/2014 8:23 AM

## 2014-04-20 NOTE — H&P (Signed)
H&P documentation  -History and Physical Reviewed  -Patient has been re-examined  -No change in the plan of care  Michelle Aguilar M  

## 2014-04-20 NOTE — Anesthesia Preprocedure Evaluation (Addendum)
Anesthesia Evaluation  Patient identified by MRN, date of birth, ID band Patient awake    Reviewed: Allergy & Precautions, NPO status , Patient's Chart, lab work & pertinent test results  Airway Mallampati: II  TM Distance: >3 FB Neck ROM: Full    Dental  (+) Dental Advisory Given   Pulmonary Current Smoker,  breath sounds clear to auscultation        Cardiovascular Rhythm:Regular Rate:Normal     Neuro/Psych    GI/Hepatic GERD-  ,  Endo/Other  Morbid obesity  Renal/GU      Musculoskeletal   Abdominal   Peds  Hematology   Anesthesia Other Findings   Reproductive/Obstetrics                            Anesthesia Physical Anesthesia Plan  ASA: II  Anesthesia Plan: General   Post-op Pain Management:    Induction: Intravenous  Airway Management Planned: Nasal ETT  Additional Equipment:   Intra-op Plan:   Post-operative Plan: Extubation in OR  Informed Consent: I have reviewed the patients History and Physical, chart, labs and discussed the procedure including the risks, benefits and alternatives for the proposed anesthesia with the patient or authorized representative who has indicated his/her understanding and acceptance.   Dental advisory given  Plan Discussed with: Anesthesiologist and Surgeon  Anesthesia Plan Comments: (Poor dentition Smoker  Plan GA with nasal ETT  Kipp Broodavid Lottie Sigman)       Anesthesia Quick Evaluation

## 2014-04-20 NOTE — Progress Notes (Signed)
Cool water mouth rinses then bite pads resumed

## 2014-04-23 ENCOUNTER — Encounter (HOSPITAL_COMMUNITY): Payer: Self-pay | Admitting: Oral Surgery

## 2014-07-03 ENCOUNTER — Emergency Department (HOSPITAL_COMMUNITY)
Admission: EM | Admit: 2014-07-03 | Discharge: 2014-07-03 | Disposition: A | Discharge status: Home / Self Care | Payer: Medicaid Other | Attending: Emergency Medicine | Admitting: Emergency Medicine

## 2014-07-03 ENCOUNTER — Emergency Department (HOSPITAL_COMMUNITY): Payer: Medicaid Other

## 2014-07-03 ENCOUNTER — Encounter (HOSPITAL_COMMUNITY): Payer: Self-pay

## 2014-07-03 DIAGNOSIS — Y9389 Activity, other specified: Secondary | ICD-10-CM | POA: Insufficient documentation

## 2014-07-03 DIAGNOSIS — Y9289 Other specified places as the place of occurrence of the external cause: Secondary | ICD-10-CM | POA: Diagnosis not present

## 2014-07-03 DIAGNOSIS — S4992XA Unspecified injury of left shoulder and upper arm, initial encounter: Secondary | ICD-10-CM | POA: Diagnosis not present

## 2014-07-03 DIAGNOSIS — Z720 Tobacco use: Secondary | ICD-10-CM | POA: Insufficient documentation

## 2014-07-03 DIAGNOSIS — Y998 Other external cause status: Secondary | ICD-10-CM | POA: Diagnosis not present

## 2014-07-03 DIAGNOSIS — S161XXA Strain of muscle, fascia and tendon at neck level, initial encounter: Secondary | ICD-10-CM | POA: Diagnosis not present

## 2014-07-03 DIAGNOSIS — Z8709 Personal history of other diseases of the respiratory system: Secondary | ICD-10-CM | POA: Diagnosis not present

## 2014-07-03 DIAGNOSIS — Z8719 Personal history of other diseases of the digestive system: Secondary | ICD-10-CM | POA: Insufficient documentation

## 2014-07-03 DIAGNOSIS — S199XXA Unspecified injury of neck, initial encounter: Secondary | ICD-10-CM | POA: Diagnosis present

## 2014-07-03 DIAGNOSIS — M25512 Pain in left shoulder: Secondary | ICD-10-CM

## 2014-07-03 DIAGNOSIS — W1839XA Other fall on same level, initial encounter: Secondary | ICD-10-CM | POA: Diagnosis not present

## 2014-07-03 MED ORDER — CYCLOBENZAPRINE HCL 5 MG PO TABS: 5.0000 mg | ORAL_TABLET | Freq: Three times a day (TID) | ORAL | Status: AC | PRN

## 2014-07-03 MED ORDER — TRAMADOL HCL 50 MG PO TABS
50.0000 mg | ORAL_TABLET | Freq: Once | ORAL | Status: AC
  Administered 2014-07-03: 50 mg via ORAL
  Filled 2014-07-03: qty 1

## 2014-07-03 MED ORDER — TRAMADOL HCL 50 MG PO TABS: 50.0000 mg | ORAL_TABLET | Freq: Four times a day (QID) | ORAL | Status: AC | PRN

## 2014-07-03 MED ORDER — IBUPROFEN 600 MG PO TABS: 600.0000 mg | ORAL_TABLET | Freq: Four times a day (QID) | ORAL | Status: DC | PRN

## 2014-07-03 MED ORDER — IBUPROFEN 800 MG PO TABS
800.0000 mg | ORAL_TABLET | Freq: Once | ORAL | Status: AC
  Administered 2014-07-03: 800 mg via ORAL
  Filled 2014-07-03: qty 1

## 2014-07-03 NOTE — Discharge Instructions (Signed)
Soft Tissue Injury of the Neck °A soft tissue injury of the neck may be either blunt or penetrating. A blunt injury does not break the skin. A penetrating injury breaks the skin, creating an open wound. Blunt injuries may happen in several ways. Most involve some type of direct blow to the neck. This can cause serious injury to the windpipe, voice box, cervical spine, or esophagus. In some cases, the injury to the soft tissue can also result in a break (fracture) of the cervical spine.  °Soft tissue injuries of the neck require immediate medical care. Sometimes, you may not notice the signs of injury right away. You may feel fine at first, but the swelling may eventually close off your airway. This could result in a significant or life-threatening injury. This is rare, but it is important to keep in mind with any injury to the neck.  °CAUSES  °Causes of blunt injury may include: °· "Clothesline" injuries. This happens when someone is moving at high speed and runs into a clothesline, outstretched arm, or similar object. This results in a direct injury to the front of the neck. If the airway is blocked, it can cause suffocation due to lack of oxygen (asphyxiation) or even instant death. °· High-energy trauma. This includes injuries from motor vehicle crashes, falling from a great height, or heavy objects falling onto the neck. °· Sports-related injuries. Injury to the windpipe and voice box can result from being struck by another player or being struck by an object, such as a baseball, hockey stick, or an outstretched arm. °· Strangulation. This type of injury may cause skin trauma, hoarseness of voice, or broken cartilage in the voice box or windpipe. It may also cause a serious airway problem. °SYMPTOMS  °· Bruising. °· Pain and tenderness in the neck. °· Swelling of the neck and face. °· Hoarseness of voice. °· Pain or difficulty with swallowing. °· Drooling or inability to swallow. °· Trouble breathing. This may  become worse when lying flat. °· Coughing up blood. °· High-pitched, harsh, vibratory noise due to partial obstruction of the windpipe (stridor). °· Swelling of the upper arms. °· Windpipe that appears to be pushed off to one side. °· Air in the tissues under the skin of the neck or chest (subcutaneous emphysema). This usually indicates a problem with the normal airway and is a medical emergency. °DIAGNOSIS  °· If possible, your caregiver may ask about the details of how the injury occurred. A detailed exam can help to identify specific areas of the neck that are injured. °· Your caregiver may ask for tests to rule out injury of the voice box, airway, or esophagus. This may include X-rays, ultrasounds, CT scans, or MRI scans, depending on the severity of your injury. °TREATMENT  °If you have an injury to your windpipe or voice box, immediate medical care is required. In almost all cases, hospitalization is necessary. For injuries that do not appear to require surgery, it is helpful to have medical observation for 24 hours. You may be asked to do one or more of the following: °· Rest your voice. °· Bed rest. °· Limit your diet, depending on the extent of the injury. Follow your caregiver's dietary guidelines. Often, only fluids and soft foods are recommended. °· Keep your head raised. °· Breathe humidified air. °· Take medicines to control infection, reduce swelling, and reduce normal stomach acid. You may also need pain medicine, depending on your injury. °For injuries that appear to require surgery,   you will need to stay in the hospital. The exact type of procedure needed will depend on your exact injury or injuries.  HOME CARE INSTRUCTIONS   If the skin was broken, keep the wound area clean and dry. Wear your bandage (dressing) and care for your wound as instructed.  Follow your caregiver's advice about your diet.  Follow your caregiver's advice about use of your voice.  Take medicines as  directed.  Keep your head and neck at least partially raised (elevated) while recovering. This should also be done while sleeping. SEEK MEDICAL CARE IF:   Your voice becomes weaker.  Your swelling or bruising is not improving as expected. Typically, this takes several days to improve.  You feel that you are having problems with medicines prescribed.  You have drainage from the injury site. This may be a sign that your wound is not healing properly or is infected.  You develop increasing pain or difficulty while swallowing.  You develop an oral temperature of 102 F (38.9 C) or higher. SEEK IMMEDIATE MEDICAL CARE IF:   You cough up blood.  You develop sudden trouble breathing.  You cannot tolerate your oral medicines, or you are unable to swallow.  You develop drooling.  You have new or worsening vomiting.  You develop sudden, new swelling of the neck or face.  You have an oral temperature above 102 F (38.9 C), not controlled by medicine. MAKE SURE YOU:  Understand these instructions.  Will watch your condition.  Will get help right away if you are not doing well or get worse. Document Released: 04/14/2007 Document Revised: 03/30/2011 Document Reviewed: 03/24/2010 [facility] Patient Information 2015 [place], [place]. This information is not intended to replace advice given to you by your health care provider. Make sure you discuss any questions you have with your health care provider.  Shoulder Pain The shoulder is the joint that connects your arms to your body. The bones that form the shoulder joint include the upper arm bone (humerus), the shoulder blade (scapula), and the collarbone (clavicle). The top of the humerus is shaped like a ball and fits into a rather flat socket on the scapula (glenoid cavity). A combination of muscles and strong, fibrous tissues that connect muscles to bones (tendons) support your shoulder joint and hold the ball in the socket. Small,  fluid-filled sacs (bursae) are located in different areas of the joint. They act as cushions between the bones and the overlying soft tissues and help reduce friction between the gliding tendons and the bone as you move your arm. Your shoulder joint allows a wide range of motion in your arm. This range of motion allows you to do things like scratch your back or throw a ball. However, this range of motion also makes your shoulder more prone to pain from overuse and injury. Causes of shoulder pain can originate from both injury and overuse and usually can be grouped in the following four categories:  Redness, swelling, and pain (inflammation) of the tendon (tendinitis) or the bursae (bursitis).  Instability, such as a dislocation of the joint.  Inflammation of the joint (arthritis).  Broken bone (fracture). HOME CARE INSTRUCTIONS   Apply ice to the sore area.  Put ice in a plastic bag.  Place a towel between your skin and the bag.  Leave the ice on for 15-20 minutes, 3-4 times per day for the first 2 days, or as directed by your health care provider.  Stop using cold packs if they  do not help with the pain.  If you have a shoulder sling or immobilizer, wear it as long as your caregiver instructs. Only remove it to shower or bathe. Move your arm as little as possible, but keep your hand moving to prevent swelling.  Squeeze a soft ball or foam pad as much as possible to help prevent swelling.  Only take over-the-counter or prescription medicines for pain, discomfort, or fever as directed by your caregiver. SEEK MEDICAL CARE IF:   Your shoulder pain increases, or new pain develops in your arm, hand, or fingers.  Your hand or fingers become cold and numb.  Your pain is not relieved with medicines. SEEK IMMEDIATE MEDICAL CARE IF:   Your arm, hand, or fingers are numb or tingling.  Your arm, hand, or fingers are significantly swollen or turn white or blue. MAKE SURE YOU:   Understand  these instructions.  Will watch your condition.  Will get help right away if you are not doing well or get worse. Document Released: 10/15/2004 Document Revised: 05/22/2013 Document Reviewed: 12/20/2010 [facility] Patient Information 2015 [place], [place]. This information is not intended to replace advice given to you by your health care provider. Make sure you discuss any questions you have with your health care provider.
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
## 2014-10-21 ENCOUNTER — Encounter (HOSPITAL_COMMUNITY): Payer: Self-pay | Admitting: Emergency Medicine

## 2014-10-21 ENCOUNTER — Emergency Department (HOSPITAL_COMMUNITY)
Admission: EM | Admit: 2014-10-21 | Discharge: 2014-10-21 | Disposition: A | Discharge status: Home / Self Care | Payer: Medicaid Other | Attending: Emergency Medicine | Admitting: Emergency Medicine

## 2014-10-21 ENCOUNTER — Emergency Department (HOSPITAL_COMMUNITY): Payer: Medicaid Other

## 2014-10-21 DIAGNOSIS — Z720 Tobacco use: Secondary | ICD-10-CM | POA: Insufficient documentation

## 2014-10-21 DIAGNOSIS — Z8719 Personal history of other diseases of the digestive system: Secondary | ICD-10-CM | POA: Diagnosis not present

## 2014-10-21 DIAGNOSIS — R05 Cough: Secondary | ICD-10-CM | POA: Diagnosis present

## 2014-10-21 DIAGNOSIS — J209 Acute bronchitis, unspecified: Secondary | ICD-10-CM | POA: Insufficient documentation

## 2014-10-21 MED ORDER — PROMETHAZINE-CODEINE 6.25-10 MG/5ML PO SYRP: 5.0000 mL | ORAL_SOLUTION | Freq: Four times a day (QID) | ORAL | Status: AC | PRN

## 2014-10-21 MED ORDER — ALBUTEROL SULFATE HFA 108 (90 BASE) MCG/ACT IN AERS
2.0000 | INHALATION_SPRAY | Freq: Once | RESPIRATORY_TRACT | Status: AC
  Administered 2014-10-21: 2 via RESPIRATORY_TRACT
  Filled 2014-10-21: qty 6.7

## 2014-10-21 MED ORDER — DEXAMETHASONE 4 MG PO TABS: 4.0000 mg | ORAL_TABLET | Freq: Two times a day (BID) | ORAL | Status: AC

## 2014-10-21 MED ORDER — PREDNISONE 50 MG PO TABS
60.0000 mg | ORAL_TABLET | Freq: Once | ORAL | Status: AC
  Administered 2014-10-21: 60 mg via ORAL
  Filled 2014-10-21 (×2): qty 1

## 2014-10-21 MED ORDER — HYDROCOD POLST-CPM POLST ER 10-8 MG/5ML PO SUER
5.0000 mL | Freq: Once | ORAL | Status: AC
Start: 2014-10-21 — End: 2014-10-21
  Administered 2014-10-21: 5 mL via ORAL
  Filled 2014-10-21: qty 5

## 2014-10-21 MED ORDER — IPRATROPIUM-ALBUTEROL 0.5-2.5 (3) MG/3ML IN SOLN
3.0000 mL | Freq: Once | RESPIRATORY_TRACT | Status: AC
  Administered 2014-10-21: 3 mL via RESPIRATORY_TRACT
  Filled 2014-10-21: qty 3

## 2014-10-21 MED ORDER — DOXYCYCLINE HYCLATE 100 MG PO CAPS: 100.0000 mg | ORAL_CAPSULE | Freq: Two times a day (BID) | ORAL | Status: AC

## 2014-10-21 NOTE — Discharge Instructions (Signed)
Please use 2 puffs of albuterol every 4 hours. Please use doxycycline and Decadron 2 times daily with food. Use the promethazine codeine cough medication every 6 hours as needed. Please see Dr. Ledell Peoples next week for follow-up and recheck of your bronchitis. Your x-ray is negative for pneumonia at this time. Acute Bronchitis Bronchitis is inflammation of the airways that extend from the windpipe into the lungs (bronchi). The inflammation often causes mucus to develop. This leads to a cough, which is the most common symptom of bronchitis.  In acute bronchitis, the condition usually develops suddenly and goes away over time, usually in a couple weeks. Smoking, allergies, and asthma can make bronchitis worse. Repeated episodes of bronchitis may cause further lung problems.  CAUSES Acute bronchitis is most often caused by the same virus that causes a cold. The virus can spread from person to person (contagious) through coughing, sneezing, and touching contaminated objects. SIGNS AND SYMPTOMS   Cough.   Fever.   Coughing up mucus.   Body aches.   Chest congestion.   Chills.   Shortness of breath.   Sore throat.  DIAGNOSIS  Acute bronchitis is usually diagnosed through a physical exam. Your health care provider will also ask you questions about your medical history. Tests, such as chest X-rays, are sometimes done to rule out other conditions.  TREATMENT  Acute bronchitis usually goes away in a couple weeks. Oftentimes, no medical treatment is necessary. Medicines are sometimes given for relief of fever or cough. Antibiotic medicines are usually not needed but may be prescribed in certain situations. In some cases, an inhaler may be recommended to help reduce shortness of breath and control the cough. A cool mist vaporizer may also be used to help thin bronchial secretions and make it easier to clear the chest.  HOME CARE INSTRUCTIONS  Get plenty of rest.   Drink enough fluids to keep  your urine clear or pale yellow (unless you have a medical condition that requires fluid restriction). Increasing fluids may help thin your respiratory secretions (sputum) and reduce chest congestion, and it will prevent dehydration.   Take medicines only as directed by your health care provider.  If you were prescribed an antibiotic medicine, finish it all even if you start to feel better.  Avoid smoking and secondhand smoke. Exposure to cigarette smoke or irritating chemicals will make bronchitis worse. If you are a smoker, consider using nicotine gum or skin patches to help control withdrawal symptoms. Quitting smoking will help your lungs heal faster.   Reduce the chances of another bout of acute bronchitis by washing your hands frequently, avoiding people with cold symptoms, and trying not to touch your hands to your mouth, nose, or eyes.   Keep all follow-up visits as directed by your health care provider.  SEEK MEDICAL CARE IF: Your symptoms do not improve after 1 week of treatment.  SEEK IMMEDIATE MEDICAL CARE IF:  You develop an increased fever or chills.   You have chest pain.   You have severe shortness of breath.  You have bloody sputum.   You develop dehydration.  You faint or repeatedly feel like you are going to pass out.  You develop repeated vomiting.  You develop a severe headache. MAKE SURE YOU:   Understand these instructions.  Will watch your condition.  Will get help right away if you are not doing well or get worse. Document Released: 02/13/2004 Document Revised: 05/22/2013 Document Reviewed: 06/28/2012 [facility] Patient Information 2015 [place], [place]. This  information is not intended to replace advice given to you by your health care provider. Make sure you discuss any questions you have with your health care provider. ° °

## 2014-10-21 NOTE — ED Provider Notes (Signed)
CSN: [id number]     Arrival date & time 10/21/14  9528 History   First MD Initiated Contact with Patient 10/21/14 [phone]     Chief Complaint  Patient presents with  . Cough     (Consider location/radiation/quality/duration/timing/severity/associated sxs/prior Treatment) Patient is a 34 y.o. female presenting with cough. The history is provided by the patient.  Cough Cough characteristics:  Productive Sputum characteristics:  Yellow Severity:  Moderate Onset quality:  Gradual Duration:  7 days Timing:  Intermittent Progression:  Worsening Chronicity:  New Smoker: yes   Context: upper respiratory infection and weather changes   Relieved by:  Nothing Worsened by:  Nothing tried Ineffective treatments:  None tried Associated symptoms: myalgias, shortness of breath, sinus congestion and wheezing   Associated symptoms: no fever and no rash   Risk factors: no chemical exposure and no recent travel     Past Medical History  Diagnosis Date  . Bronchitis   . GERD (gastroesophageal reflux disease)     occ tums   Past Surgical History  Procedure Laterality Date  . Myringotomy Bilateral     34 yrs old  . Multiple extractions with alveoloplasty N/A 04/20/2014    Procedure: MULTIPLE EXTRACTIONS WITH ALVEOLOPLASTY AND TORI;  Surgeon: Ocie Doyne, DDS;  Location: [facility] OR;  Service: Oral Surgery;  Laterality: N/A;   History reviewed. No pertinent family history. Social History  Substance Use Topics  . Smoking status: Current Every Day Smoker -- 1.50 packs/day for 22 years    Types: Cigarettes  . Smokeless tobacco: None  . Alcohol Use: No   OB History    Gravida Para Term Preterm AB TAB SAB Ectopic Multiple Living            1     Review of Systems  Constitutional: Negative for fever.  Respiratory: Positive for cough, shortness of breath and wheezing.   Musculoskeletal: Positive for myalgias.  Skin: Negative for rash.  All other systems reviewed and are  negative.     Allergies  Review of patient's allergies indicates no known allergies.  Home Medications   Prior to Admission medications   Medication Sig Start Date End Date Taking? Authorizing Provider  Acetaminophen-Caff-Pyrilamine (MIDOL COMPLETE PO) Take 1 tablet by mouth daily as needed (pain).    Historical Provider, MD  cyclobenzaprine (FLEXERIL) 5 MG tablet Take 1 tablet (5 mg total) by mouth 3 (three) times daily as needed for muscle spasms. 07/03/14   Burgess Amor, PA-C  ibuprofen (ADVIL,MOTRIN) 600 MG tablet Take 1 tablet (600 mg total) by mouth every 6 (six) hours as needed. 07/03/14   Burgess Amor, PA-C  traMADol (ULTRAM) 50 MG tablet Take 1 tablet (50 mg total) by mouth every 6 (six) hours as needed. 07/03/14   Burgess Amor, PA-C   BP 126/86 mmHg  Pulse 80  Temp(Src) 98.3 F (36.8 C) (Oral)  Resp 18  Ht  (1.651 m)  Wt 200 lb (90.719 kg)  BMI 33.28 kg/m2  SpO2 96%  LMP 09/24/2014 Physical Exam  Constitutional: She is oriented to person, place, and time. She appears well-developed and well-nourished.  Non-toxic appearance.  HENT:  Head: Normocephalic.  Right Ear: Tympanic membrane and external ear normal.  Left Ear: Tympanic membrane and external ear normal.  Nasal congestion  Eyes: EOM and lids are normal. Pupils are equal, round, and reactive to light.  Neck: Normal range of motion. Neck supple. Carotid bruit is not present.  Cardiovascular: Normal rate, regular rhythm, normal heart sounds,  intact distal pulses and normal pulses.   Pulmonary/Chest: No respiratory distress. She has wheezes.  Frequent cough noted, but pt speaks in complete sentences.  Abdominal: Soft. Bowel sounds are normal. There is no tenderness. There is no guarding.  Musculoskeletal: Normal range of motion. She exhibits no edema or tenderness.  Neg Homan's sign. Cap refill less than 2 sec.  Lymphadenopathy:       Head (right side): No submandibular adenopathy present.       Head (left side):  No submandibular adenopathy present.    She has no cervical adenopathy.  Neurological: She is alert and oriented to person, place, and time. She has normal strength. No cranial nerve deficit or sensory deficit.  Skin: Skin is warm and dry.  Psychiatric: She has a normal mood and affect. Her speech is normal.  Nursing note and vitals reviewed.   ED Course  Procedures (including critical care time) Labs Review Labs Reviewed - No data to display  Imaging Review No results found. I have personally reviewed and evaluated these images and lab results as part of my medical decision-making.   EKG Interpretation None      MDM  Vital signs reviewed. The patient is breathing easier, and wheezes have much improved after medications. Cough is significantly improved after medications. The chest x-ray is negative for pneumonias or other acute event.  Patient is ambulatory without problem in the room and hall. Feel that it is safe for the patient be discharged home. Prescription for Decadron, doxycycline, and promethazine codeine cough medication given to the patient. Patient is also given instructions to stop smoking, and also to increase fluids. Patient is to follow with primary care physician.    Final diagnoses:  None    *I have reviewed nursing notes, vital signs, and all appropriate lab and imaging results for this patient.[address], PA-C 10/21/14 1401  Bethann Berkshire, MD 10/22/14 1230

## 2014-10-21 NOTE — ED Notes (Signed)
PT c/o nasal congestion, productive yellow sputum cough and body aches x7 days.

## 2016-06-22 IMAGING — DX DG SHOULDER 2+V*L*
3 series · 3 of 3 positions shown · non-contrast
Comparison: None.

CLINICAL DATA: Status post fall today while walking. Left shoulder
pain. Initial encounter.

EXAM:
LEFT SHOULDER - 2+ VIEW

[shoulder ap]
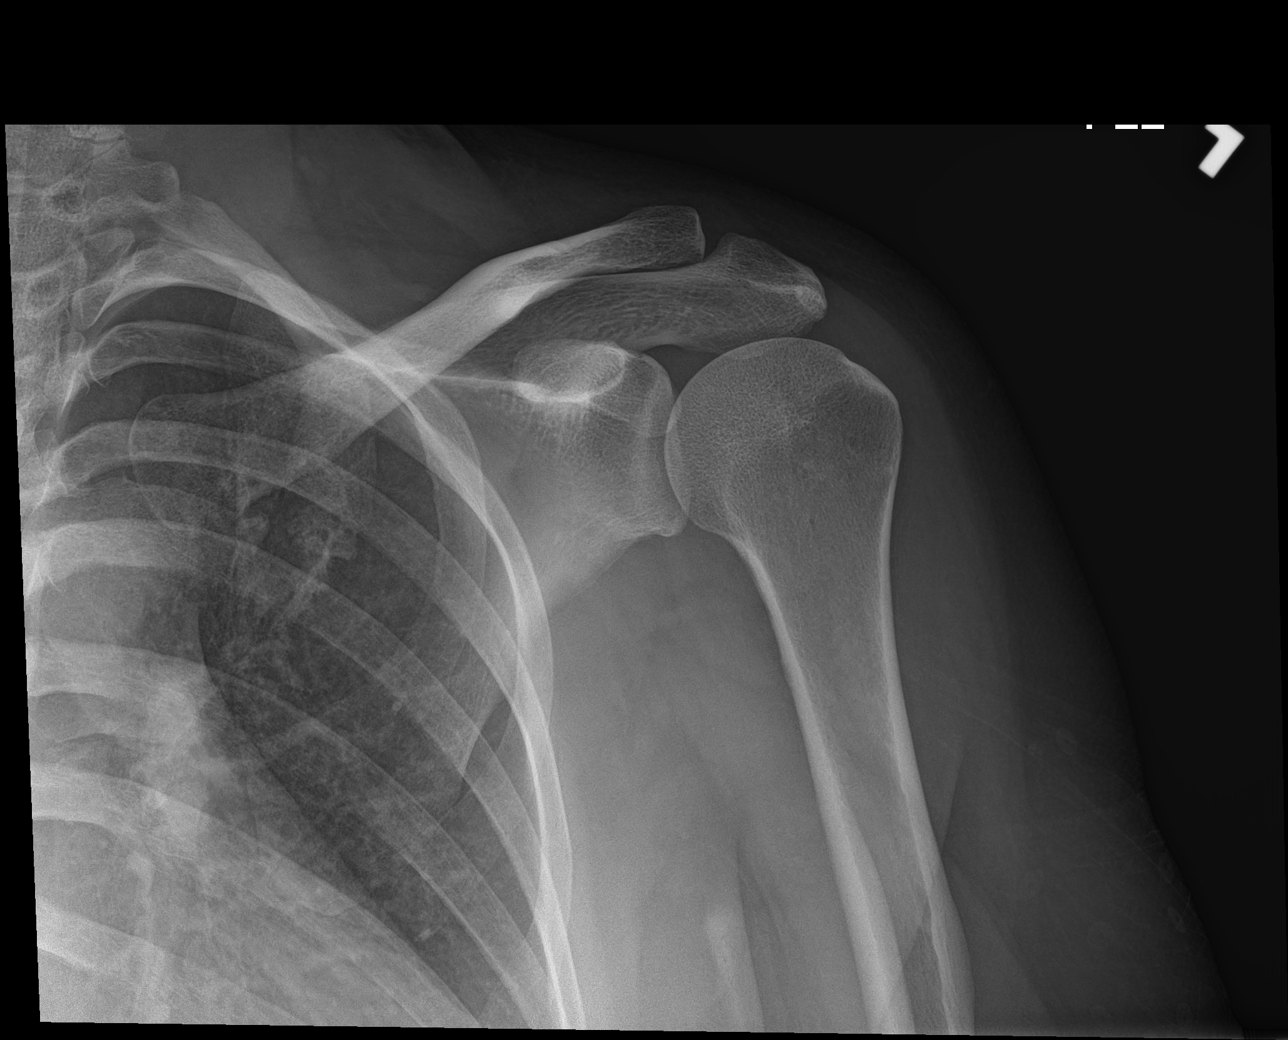

[shoulder y view]
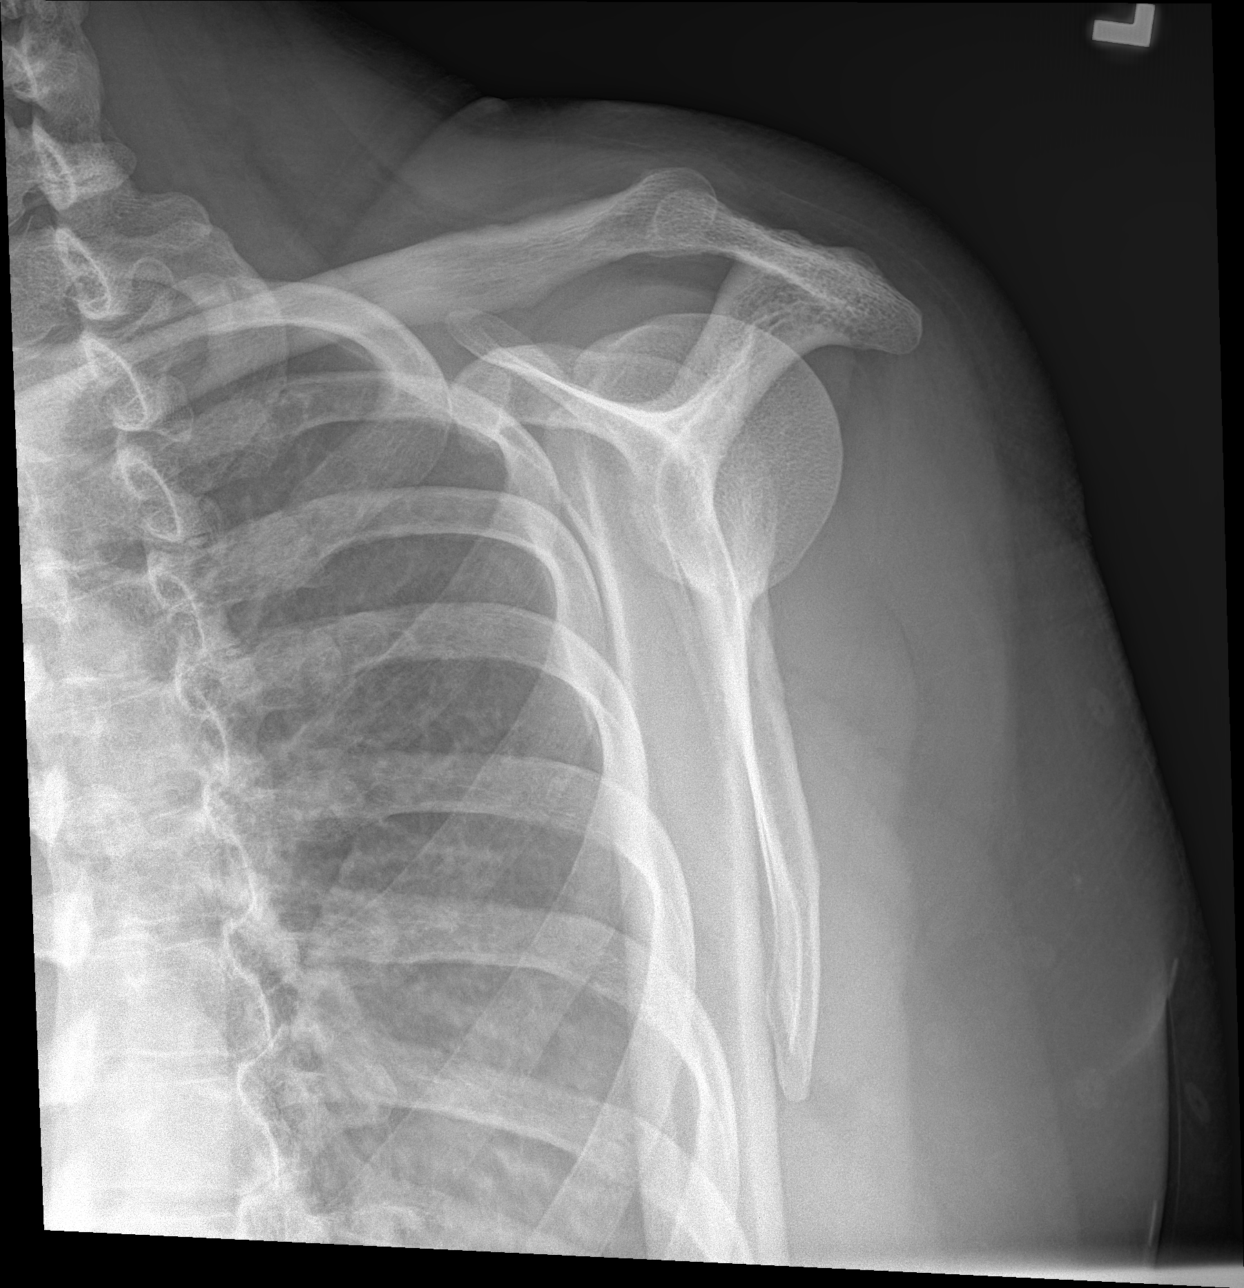

[shoulder axillary]
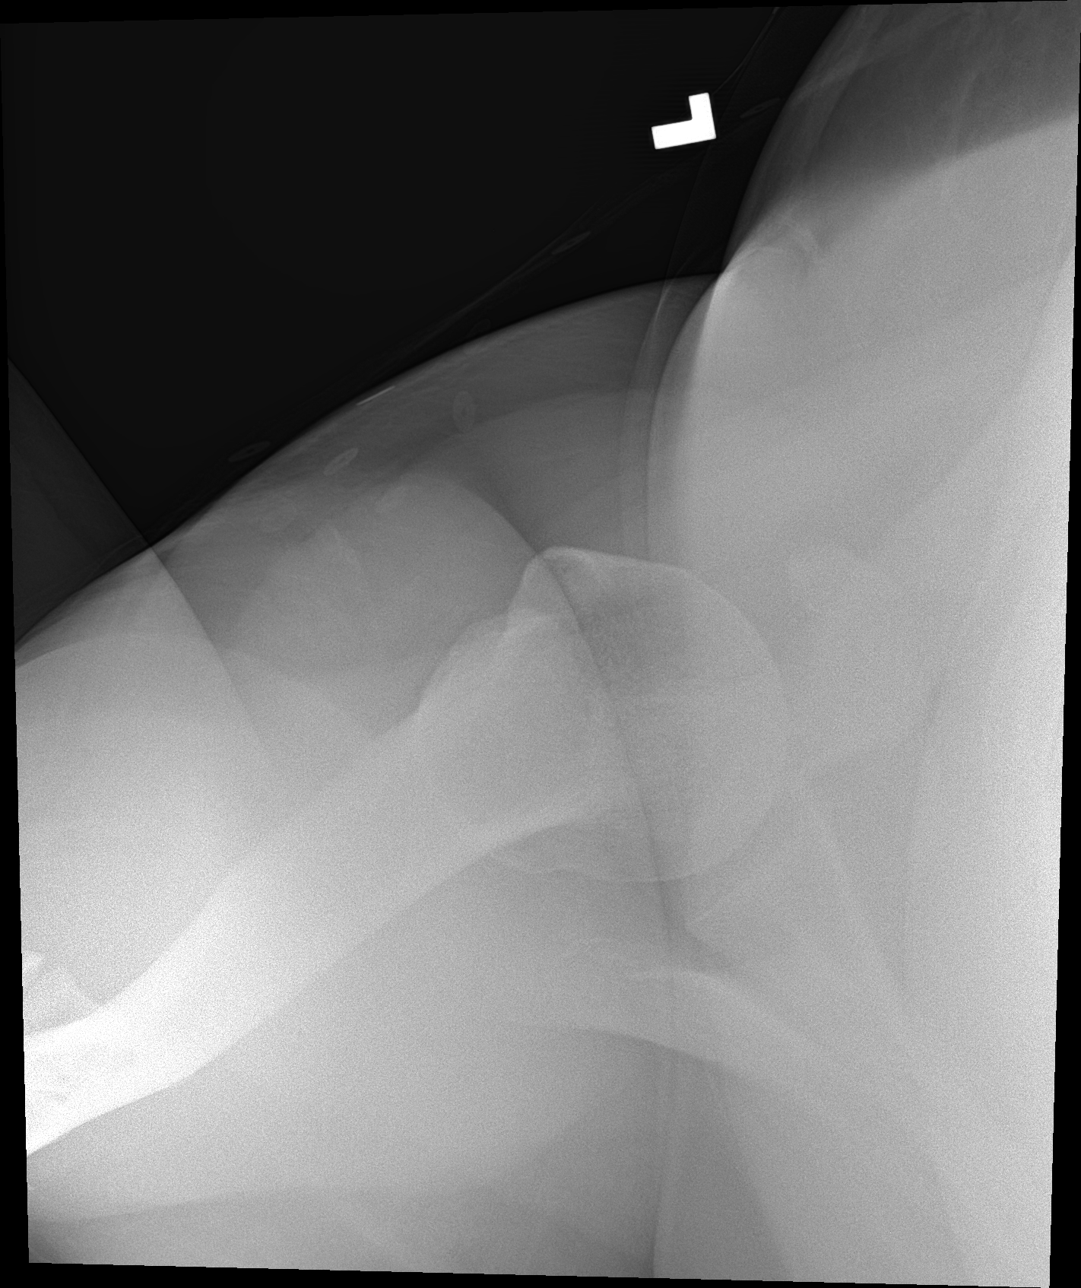

[3 of 3 positions shown; findings below may reference images not displayed]

FINDINGS: Imaged bones, joints and soft tissues appear normal.
IMPRESSION: Negative exam.

## 2016-10-10 IMAGING — DX DG CHEST 2V
2 series · 2 of 2 positions shown · non-contrast
Comparison: Chest CT June 01, 2004

CLINICAL DATA: One week history of shortness of breath and cough

EXAM:
CHEST  2 VIEW

[chest pa]
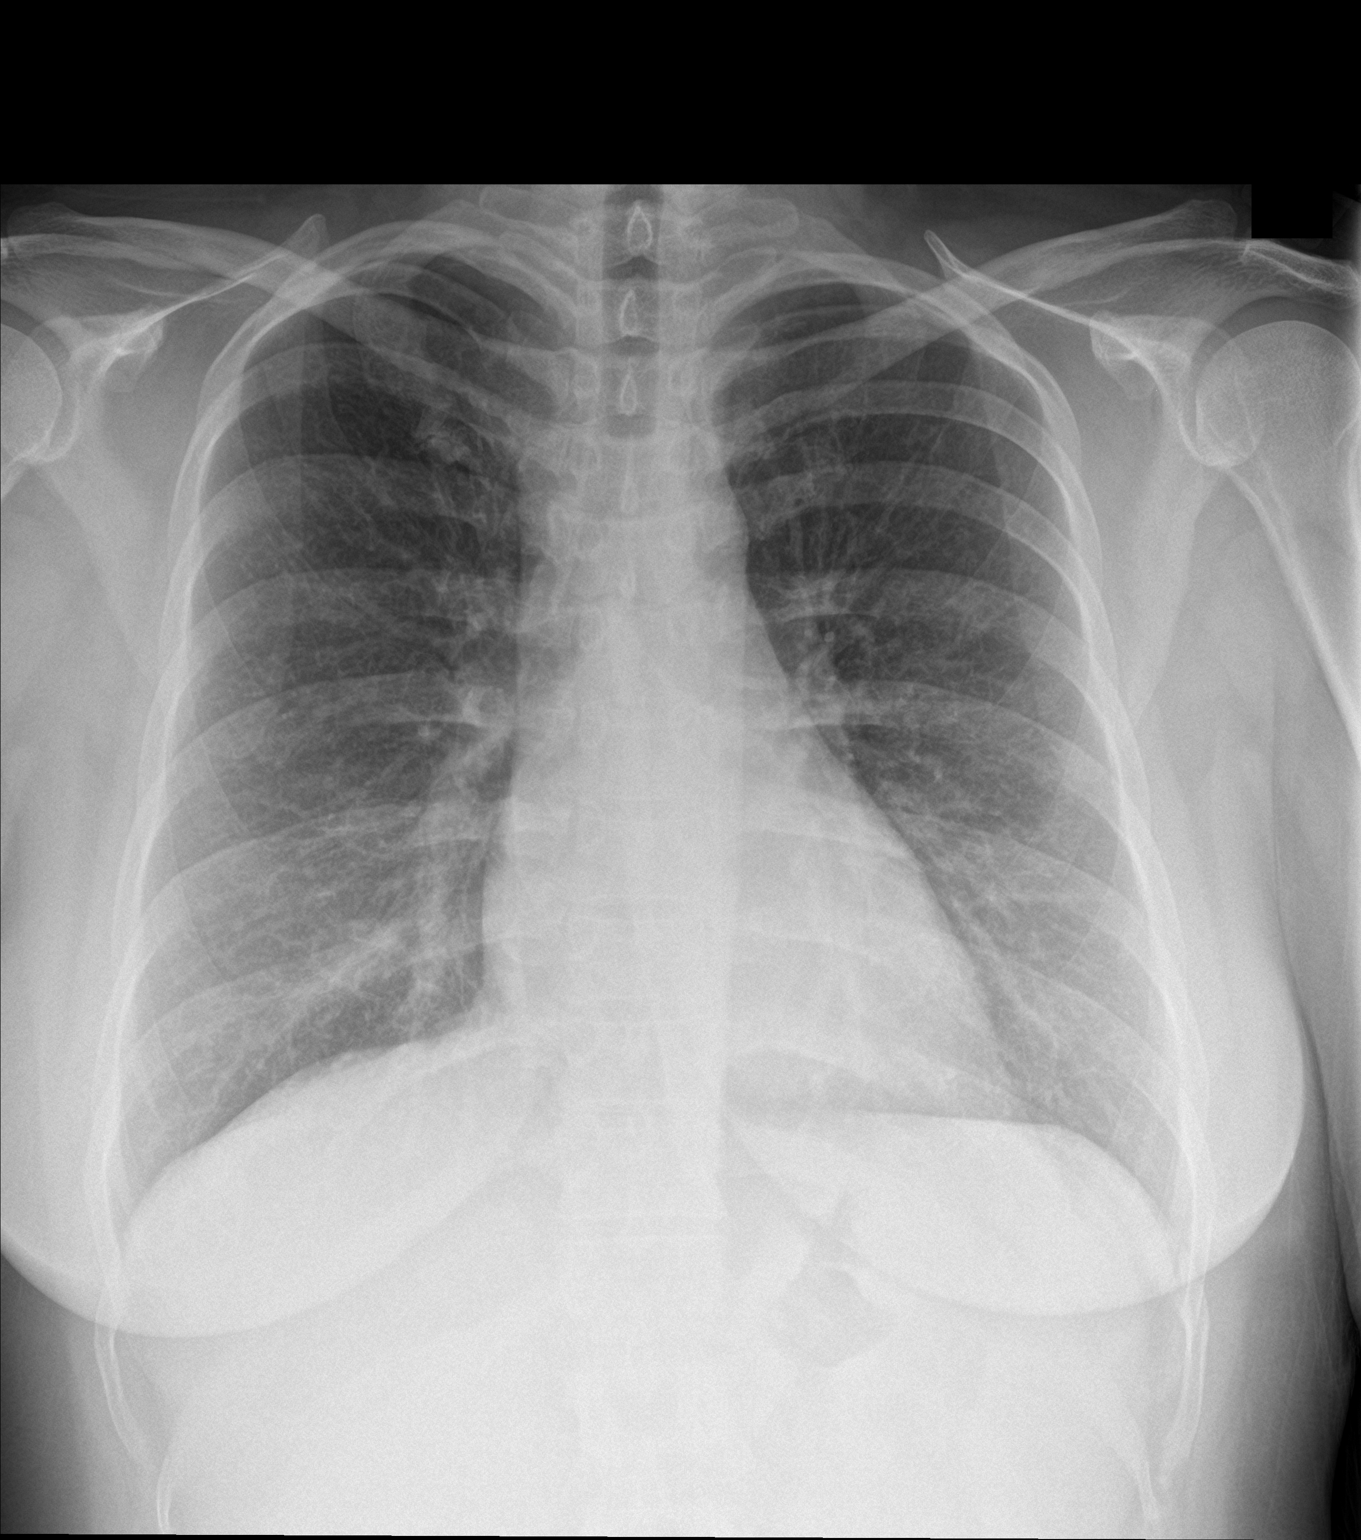

[chest lat]
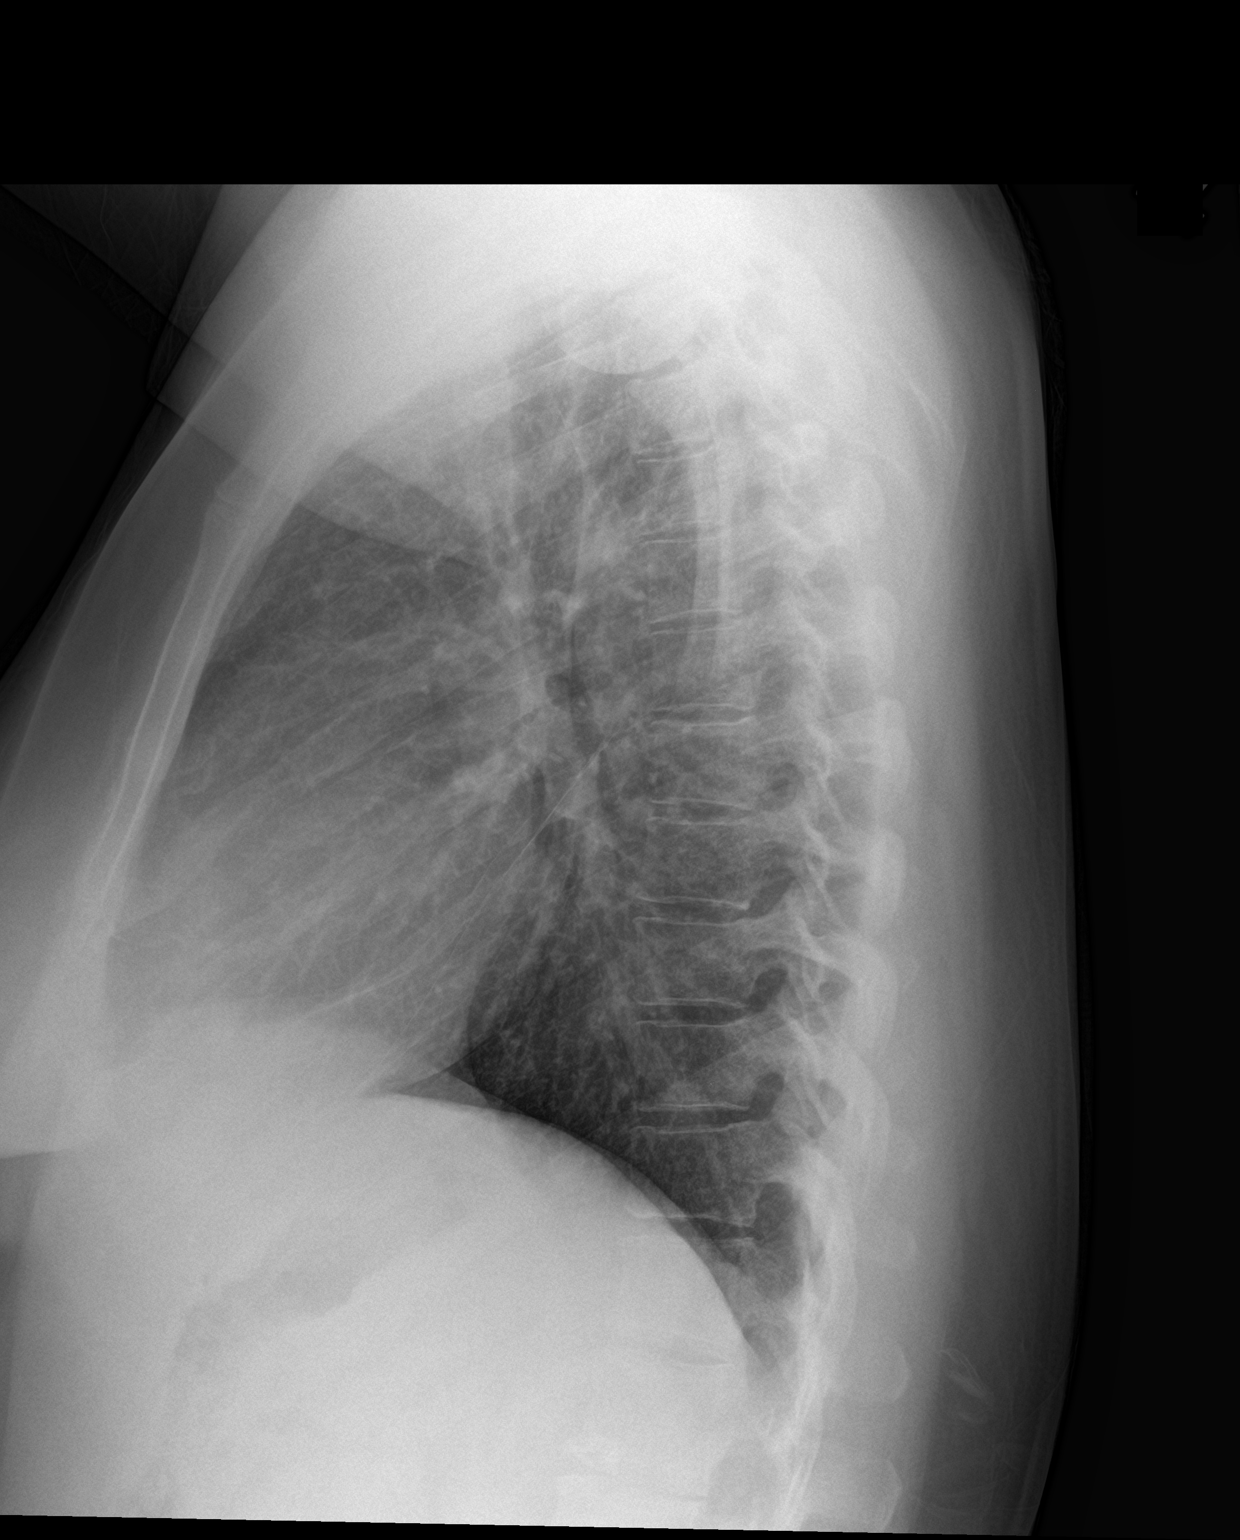

[2 of 2 positions shown; findings below may reference images not displayed]

FINDINGS: Lungs are clear. The heart size and pulmonary vascularity are
normal. No adenopathy. No bone lesions.
IMPRESSION: No edema or consolidation.
